# Patient Record
Sex: Female | Born: 1955 | Race: White | Hispanic: No
Health system: Southern US, Community
[De-identification: ages and names within clinical notes are randomized; demographics above are authoritative.]

## PROBLEM LIST (undated history)

## (undated) DIAGNOSIS — M25473 Effusion, unspecified ankle: Secondary | ICD-10-CM

## (undated) DIAGNOSIS — E079 Disorder of thyroid, unspecified: Secondary | ICD-10-CM

## (undated) DIAGNOSIS — F419 Anxiety disorder, unspecified: Secondary | ICD-10-CM

## (undated) DIAGNOSIS — F329 Major depressive disorder, single episode, unspecified: Secondary | ICD-10-CM

## (undated) DIAGNOSIS — Z923 Personal history of irradiation: Secondary | ICD-10-CM

## (undated) DIAGNOSIS — C50919 Malignant neoplasm of unspecified site of unspecified female breast: Secondary | ICD-10-CM

## (undated) DIAGNOSIS — J302 Other seasonal allergic rhinitis: Secondary | ICD-10-CM

## (undated) DIAGNOSIS — N189 Chronic kidney disease, unspecified: Secondary | ICD-10-CM

## (undated) DIAGNOSIS — E7800 Pure hypercholesterolemia, unspecified: Secondary | ICD-10-CM

## (undated) DIAGNOSIS — C801 Malignant (primary) neoplasm, unspecified: Secondary | ICD-10-CM

## (undated) DIAGNOSIS — F32A Depression, unspecified: Secondary | ICD-10-CM

## (undated) DIAGNOSIS — K219 Gastro-esophageal reflux disease without esophagitis: Secondary | ICD-10-CM

## (undated) DIAGNOSIS — G4730 Sleep apnea, unspecified: Secondary | ICD-10-CM

## (undated) DIAGNOSIS — R112 Nausea with vomiting, unspecified: Secondary | ICD-10-CM

## (undated) DIAGNOSIS — E039 Hypothyroidism, unspecified: Secondary | ICD-10-CM

## (undated) DIAGNOSIS — Z9221 Personal history of antineoplastic chemotherapy: Secondary | ICD-10-CM

## (undated) DIAGNOSIS — Z9889 Other specified postprocedural states: Secondary | ICD-10-CM

## (undated) HISTORY — DX: Disorder of thyroid, unspecified: E07.9

## (undated) HISTORY — DX: Gastro-esophageal reflux disease without esophagitis: K21.9

## (undated) HISTORY — PX: OTHER SURGICAL HISTORY: SHX169

## (undated) HISTORY — PX: HEEL SPUR EXCISION: SHX1733

## (undated) HISTORY — PX: EYE SURGERY: SHX253

## (undated) HISTORY — PX: ABDOMINAL HYSTERECTOMY: SHX81

---

## 2004-09-06 ENCOUNTER — Ambulatory Visit: Payer: Self-pay | Admitting: Obstetrics and Gynecology

## 2004-09-10 ENCOUNTER — Ambulatory Visit: Payer: Self-pay | Admitting: Urology

## 2004-09-28 ENCOUNTER — Ambulatory Visit: Payer: Self-pay | Admitting: Gastroenterology

## 2005-09-10 ENCOUNTER — Ambulatory Visit: Payer: Self-pay | Admitting: Obstetrics and Gynecology

## 2006-02-22 HISTORY — PX: RETINAL DETACHMENT SURGERY: SHX105

## 2006-09-16 ENCOUNTER — Ambulatory Visit: Payer: Self-pay | Admitting: Obstetrics and Gynecology

## 2006-12-08 ENCOUNTER — Ambulatory Visit: Payer: Self-pay | Admitting: Gastroenterology

## 2007-06-25 DIAGNOSIS — C50919 Malignant neoplasm of unspecified site of unspecified female breast: Secondary | ICD-10-CM

## 2007-06-25 HISTORY — PX: BREAST EXCISIONAL BIOPSY: SUR124

## 2007-06-25 HISTORY — PX: BREAST LUMPECTOMY: SHX2

## 2007-06-25 HISTORY — DX: Malignant neoplasm of unspecified site of unspecified female breast: C50.919

## 2007-09-21 ENCOUNTER — Ambulatory Visit: Payer: Self-pay | Admitting: Obstetrics and Gynecology

## 2007-09-23 ENCOUNTER — Ambulatory Visit: Payer: Self-pay | Admitting: Obstetrics and Gynecology

## 2007-10-13 ENCOUNTER — Ambulatory Visit: Payer: Self-pay | Admitting: General Surgery

## 2007-10-22 ENCOUNTER — Ambulatory Visit: Payer: Self-pay | Admitting: General Surgery

## 2007-10-23 ENCOUNTER — Ambulatory Visit: Payer: Self-pay | Admitting: Radiation Oncology

## 2007-10-23 ENCOUNTER — Ambulatory Visit: Payer: Self-pay | Admitting: General Surgery

## 2007-10-23 HISTORY — PX: BREAST SURGERY: SHX581

## 2007-10-25 ENCOUNTER — Inpatient Hospital Stay: Payer: Self-pay | Admitting: General Surgery

## 2007-11-12 ENCOUNTER — Ambulatory Visit: Payer: Self-pay | Admitting: Oncology

## 2007-11-19 ENCOUNTER — Ambulatory Visit: Payer: Self-pay | Admitting: Vascular Surgery

## 2007-11-23 ENCOUNTER — Ambulatory Visit: Payer: Self-pay | Admitting: Radiation Oncology

## 2007-11-23 ENCOUNTER — Ambulatory Visit: Payer: Self-pay | Admitting: Oncology

## 2007-12-23 ENCOUNTER — Ambulatory Visit: Payer: Self-pay | Admitting: Oncology

## 2007-12-23 ENCOUNTER — Ambulatory Visit: Payer: Self-pay | Admitting: Radiation Oncology

## 2008-01-23 ENCOUNTER — Ambulatory Visit: Payer: Self-pay | Admitting: Oncology

## 2008-01-23 ENCOUNTER — Ambulatory Visit: Payer: Self-pay | Admitting: Radiation Oncology

## 2008-02-23 ENCOUNTER — Ambulatory Visit: Payer: Self-pay | Admitting: Oncology

## 2008-02-23 ENCOUNTER — Ambulatory Visit: Payer: Self-pay | Admitting: Radiation Oncology

## 2008-03-24 ENCOUNTER — Ambulatory Visit: Payer: Self-pay | Admitting: Radiation Oncology

## 2008-03-24 ENCOUNTER — Ambulatory Visit: Payer: Self-pay | Admitting: Oncology

## 2008-04-24 ENCOUNTER — Ambulatory Visit: Payer: Self-pay | Admitting: Oncology

## 2008-04-24 ENCOUNTER — Ambulatory Visit: Payer: Self-pay | Admitting: Radiation Oncology

## 2008-05-24 ENCOUNTER — Ambulatory Visit: Payer: Self-pay | Admitting: Oncology

## 2008-05-24 ENCOUNTER — Ambulatory Visit: Payer: Self-pay | Admitting: Radiation Oncology

## 2008-06-24 ENCOUNTER — Ambulatory Visit: Payer: Self-pay | Admitting: Radiation Oncology

## 2008-06-24 ENCOUNTER — Ambulatory Visit: Payer: Self-pay | Admitting: Oncology

## 2008-07-25 ENCOUNTER — Ambulatory Visit: Payer: Self-pay | Admitting: Radiation Oncology

## 2008-07-25 ENCOUNTER — Ambulatory Visit: Payer: Self-pay | Admitting: Oncology

## 2008-08-22 ENCOUNTER — Ambulatory Visit: Payer: Self-pay | Admitting: Radiation Oncology

## 2008-08-22 ENCOUNTER — Ambulatory Visit: Payer: Self-pay | Admitting: Oncology

## 2008-09-22 ENCOUNTER — Ambulatory Visit: Payer: Self-pay | Admitting: Oncology

## 2008-09-22 ENCOUNTER — Ambulatory Visit: Payer: Self-pay | Admitting: Radiation Oncology

## 2008-10-22 ENCOUNTER — Ambulatory Visit: Payer: Self-pay | Admitting: Oncology

## 2008-10-22 ENCOUNTER — Ambulatory Visit: Payer: Self-pay | Admitting: Radiation Oncology

## 2008-11-22 ENCOUNTER — Ambulatory Visit: Payer: Self-pay | Admitting: Radiation Oncology

## 2008-11-22 ENCOUNTER — Ambulatory Visit: Payer: Self-pay | Admitting: Oncology

## 2008-12-22 ENCOUNTER — Ambulatory Visit: Payer: Self-pay | Admitting: Oncology

## 2009-01-22 ENCOUNTER — Ambulatory Visit: Payer: Self-pay | Admitting: Oncology

## 2009-02-22 ENCOUNTER — Ambulatory Visit: Payer: Self-pay | Admitting: Oncology

## 2009-03-02 ENCOUNTER — Ambulatory Visit: Payer: Self-pay | Admitting: Vascular Surgery

## 2009-03-24 ENCOUNTER — Ambulatory Visit: Payer: Self-pay | Admitting: Oncology

## 2009-04-24 ENCOUNTER — Ambulatory Visit: Payer: Self-pay | Admitting: Oncology

## 2009-05-24 ENCOUNTER — Ambulatory Visit: Payer: Self-pay | Admitting: Oncology

## 2009-05-25 ENCOUNTER — Ambulatory Visit: Payer: Self-pay | Admitting: Oncology

## 2009-06-24 ENCOUNTER — Ambulatory Visit: Payer: Self-pay | Admitting: Oncology

## 2009-06-24 HISTORY — PX: BREAST BIOPSY: SHX20

## 2009-08-22 ENCOUNTER — Ambulatory Visit: Payer: Self-pay | Admitting: Oncology

## 2009-08-28 ENCOUNTER — Ambulatory Visit: Payer: Self-pay | Admitting: Oncology

## 2009-08-31 ENCOUNTER — Ambulatory Visit: Payer: Self-pay | Admitting: Oncology

## 2009-09-04 ENCOUNTER — Ambulatory Visit: Payer: Self-pay | Admitting: Oncology

## 2009-09-22 ENCOUNTER — Ambulatory Visit: Payer: Self-pay | Admitting: Oncology

## 2009-09-26 ENCOUNTER — Ambulatory Visit: Payer: Self-pay | Admitting: Surgery

## 2009-11-22 ENCOUNTER — Ambulatory Visit: Payer: Self-pay | Admitting: Oncology

## 2009-12-05 ENCOUNTER — Ambulatory Visit: Payer: Self-pay | Admitting: Oncology

## 2009-12-22 ENCOUNTER — Ambulatory Visit: Payer: Self-pay | Admitting: Oncology

## 2010-02-22 ENCOUNTER — Ambulatory Visit: Payer: Self-pay | Admitting: Oncology

## 2010-02-27 ENCOUNTER — Ambulatory Visit: Payer: Self-pay | Admitting: Oncology

## 2010-03-07 ENCOUNTER — Ambulatory Visit: Payer: Self-pay | Admitting: Oncology

## 2010-03-24 ENCOUNTER — Ambulatory Visit: Payer: Self-pay | Admitting: Oncology

## 2010-04-24 ENCOUNTER — Ambulatory Visit: Payer: Self-pay | Admitting: Oncology

## 2010-06-07 ENCOUNTER — Ambulatory Visit: Payer: Self-pay | Admitting: Oncology

## 2010-06-24 ENCOUNTER — Ambulatory Visit: Payer: Self-pay | Admitting: Oncology

## 2010-09-10 ENCOUNTER — Ambulatory Visit: Payer: Self-pay | Admitting: Oncology

## 2010-09-17 ENCOUNTER — Ambulatory Visit: Payer: Self-pay | Admitting: Oncology

## 2010-09-18 LAB — CANCER ANTIGEN 27.29: CA 27.29: 15.4 U/mL (ref 0.0–38.6)

## 2010-09-23 ENCOUNTER — Ambulatory Visit: Payer: Self-pay | Admitting: Oncology

## 2010-12-17 ENCOUNTER — Ambulatory Visit: Payer: Self-pay | Admitting: Oncology

## 2010-12-18 LAB — CANCER ANTIGEN 27.29: CA 27.29: 14 U/mL (ref 0.0–38.6)

## 2010-12-23 ENCOUNTER — Ambulatory Visit: Payer: Self-pay | Admitting: Oncology

## 2011-03-12 ENCOUNTER — Ambulatory Visit: Payer: Self-pay | Admitting: Oncology

## 2011-04-15 ENCOUNTER — Ambulatory Visit: Payer: Self-pay | Admitting: Oncology

## 2011-04-25 ENCOUNTER — Ambulatory Visit: Payer: Self-pay | Admitting: Oncology

## 2011-06-04 ENCOUNTER — Ambulatory Visit: Payer: Self-pay | Admitting: Oncology

## 2011-06-25 ENCOUNTER — Ambulatory Visit: Payer: Self-pay | Admitting: Oncology

## 2011-09-11 ENCOUNTER — Ambulatory Visit: Payer: Self-pay | Admitting: Oncology

## 2011-12-01 ENCOUNTER — Emergency Department: Payer: Self-pay | Admitting: Emergency Medicine

## 2011-12-01 LAB — CBC
HCT: 38.8 % (ref 35.0–47.0)
MCHC: 33.8 g/dL (ref 32.0–36.0)
Platelet: 195 10*3/uL (ref 150–440)
RBC: 4.12 10*6/uL (ref 3.80–5.20)
RDW: 13.2 % (ref 11.5–14.5)

## 2011-12-01 LAB — COMPREHENSIVE METABOLIC PANEL
Albumin: 3.9 g/dL (ref 3.4–5.0)
Alkaline Phosphatase: 80 U/L (ref 50–136)
BUN: 22 mg/dL — ABNORMAL HIGH (ref 7–18)
Bilirubin,Total: 0.4 mg/dL (ref 0.2–1.0)
Calcium, Total: 9.4 mg/dL (ref 8.5–10.1)
Chloride: 105 mmol/L (ref 98–107)
Co2: 26 mmol/L (ref 21–32)
Creatinine: 0.78 mg/dL (ref 0.60–1.30)
EGFR (African American): >60
EGFR (Non-African Amer.): >60
Glucose: 131 mg/dL — ABNORMAL HIGH (ref 65–99)
Osmolality: 286 (ref 275–301)
Potassium: 3.4 mmol/L — ABNORMAL LOW (ref 3.5–5.1)
SGOT(AST): 29 U/L (ref 15–37)
Total Protein: 7.2 g/dL (ref 6.4–8.2)

## 2011-12-05 ENCOUNTER — Ambulatory Visit: Payer: Self-pay | Admitting: Oncology

## 2011-12-06 LAB — CANCER ANTIGEN 27.29: CA 27.29: 15.7 U/mL (ref 0.0–38.6)

## 2011-12-23 ENCOUNTER — Ambulatory Visit: Payer: Self-pay | Admitting: Oncology

## 2012-03-12 ENCOUNTER — Ambulatory Visit: Payer: Self-pay | Admitting: Oncology

## 2012-06-08 ENCOUNTER — Ambulatory Visit: Payer: Self-pay | Admitting: Oncology

## 2012-06-24 ENCOUNTER — Ambulatory Visit: Payer: Self-pay | Admitting: Oncology

## 2012-06-24 HISTORY — PX: BREAST BIOPSY: SHX20

## 2012-09-14 ENCOUNTER — Ambulatory Visit: Payer: Self-pay | Admitting: Oncology

## 2012-09-15 ENCOUNTER — Ambulatory Visit: Payer: Self-pay | Admitting: Oncology

## 2012-09-28 ENCOUNTER — Ambulatory Visit: Payer: Self-pay | Admitting: Surgery

## 2012-09-30 LAB — PATHOLOGY REPORT

## 2012-11-22 ENCOUNTER — Ambulatory Visit: Payer: Self-pay | Admitting: Oncology

## 2012-12-22 ENCOUNTER — Ambulatory Visit: Payer: Self-pay | Admitting: Oncology

## 2013-03-22 ENCOUNTER — Ambulatory Visit: Payer: Self-pay | Admitting: Specialist

## 2013-03-31 ENCOUNTER — Ambulatory Visit: Payer: Self-pay | Admitting: Surgery

## 2013-03-31 ENCOUNTER — Ambulatory Visit: Payer: Self-pay | Admitting: Oncology

## 2013-06-08 ENCOUNTER — Ambulatory Visit: Payer: Self-pay | Admitting: Oncology

## 2013-06-24 ENCOUNTER — Ambulatory Visit: Payer: Self-pay | Admitting: Oncology

## 2013-07-07 ENCOUNTER — Ambulatory Visit: Payer: Self-pay | Admitting: Specialist

## 2013-07-07 LAB — CBC
HCT: 38.6 % (ref 35.0–47.0)
HGB: 13.3 g/dL (ref 12.0–16.0)
MCH: 31.5 pg (ref 26.0–34.0)
MCHC: 34.5 g/dL (ref 32.0–36.0)
MCV: 91 fL (ref 80–100)
Platelet: 200 10*3/uL (ref 150–440)
RBC: 4.23 10*6/uL (ref 3.80–5.20)
RDW: 12 % (ref 11.5–14.5)
WBC: 5.2 10*3/uL (ref 3.6–11.0)

## 2013-07-07 LAB — URINALYSIS, COMPLETE
BACTERIA: NONE SEEN
Bilirubin,UR: NEGATIVE
Blood: NEGATIVE
Glucose,UR: NEGATIVE mg/dL (ref 0–75)
Ketone: NEGATIVE
Leukocyte Esterase: NEGATIVE
Nitrite: NEGATIVE
Ph: 5 (ref 4.5–8.0)
Protein: NEGATIVE
RBC,UR: 2 /HPF (ref 0–5)
Specific Gravity: 1.033 (ref 1.003–1.030)
Squamous Epithelial: 1
WBC UR: 1 /HPF (ref 0–5)

## 2013-07-07 LAB — BASIC METABOLIC PANEL
ANION GAP: 7 (ref 7–16)
BUN: 18 mg/dL (ref 7–18)
Calcium, Total: 9.2 mg/dL (ref 8.5–10.1)
Chloride: 105 mmol/L (ref 98–107)
Co2: 27 mmol/L (ref 21–32)
Creatinine: 0.84 mg/dL (ref 0.60–1.30)
EGFR (African American): >60
EGFR (Non-African Amer.): >60
Glucose: 116 mg/dL — ABNORMAL HIGH (ref 65–99)
Osmolality: 280 (ref 275–301)
Potassium: 3.5 mmol/L (ref 3.5–5.1)
SODIUM: 139 mmol/L (ref 136–145)

## 2013-07-07 LAB — PROTIME-INR
INR: 1
Prothrombin Time: 12.8 secs (ref 11.5–14.7)

## 2013-07-07 LAB — APTT: ACTIVATED PTT: 26.4 s (ref 23.6–35.9)

## 2013-07-07 LAB — MRSA PCR SCREENING

## 2013-07-14 ENCOUNTER — Inpatient Hospital Stay: Payer: Self-pay | Admitting: Specialist

## 2013-07-14 HISTORY — PX: JOINT REPLACEMENT: SHX530

## 2013-07-15 LAB — BASIC METABOLIC PANEL
Anion Gap: 4 — ABNORMAL LOW (ref 7–16)
BUN: 12 mg/dL (ref 7–18)
Calcium, Total: 9 mg/dL (ref 8.5–10.1)
Chloride: 104 mmol/L (ref 98–107)
Co2: 29 mmol/L (ref 21–32)
Creatinine: 0.82 mg/dL (ref 0.60–1.30)
EGFR (African American): >60
Glucose: 140 mg/dL — ABNORMAL HIGH (ref 65–99)
Osmolality: 276 (ref 275–301)
Potassium: 4.2 mmol/L (ref 3.5–5.1)
Sodium: 137 mmol/L (ref 136–145)

## 2013-07-15 LAB — CBC WITH DIFFERENTIAL/PLATELET
BASOS ABS: 0 10*3/uL (ref 0.0–0.1)
Basophil %: 0.3 %
Eosinophil #: 0.1 10*3/uL (ref 0.0–0.7)
Eosinophil %: 1.1 %
HCT: 34.9 % — AB (ref 35.0–47.0)
HGB: 12.1 g/dL (ref 12.0–16.0)
LYMPHS ABS: 0.8 10*3/uL — AB (ref 1.0–3.6)
LYMPHS PCT: 10.7 %
MCH: 32.3 pg (ref 26.0–34.0)
MCHC: 34.7 g/dL (ref 32.0–36.0)
MCV: 93 fL (ref 80–100)
MONO ABS: 0.5 x10 3/mm (ref 0.2–0.9)
Monocyte %: 6.9 %
NEUTROS ABS: 6.4 10*3/uL (ref 1.4–6.5)
Neutrophil %: 81 %
Platelet: 168 10*3/uL (ref 150–440)
RBC: 3.74 10*6/uL — AB (ref 3.80–5.20)
RDW: 12.3 % (ref 11.5–14.5)
WBC: 7.9 10*3/uL (ref 3.6–11.0)

## 2013-07-16 LAB — HEMOGLOBIN: HGB: 11.2 g/dL — ABNORMAL LOW (ref 12.0–16.0)

## 2013-09-30 ENCOUNTER — Ambulatory Visit: Payer: Self-pay | Admitting: Oncology

## 2014-02-24 ENCOUNTER — Ambulatory Visit: Payer: Self-pay | Admitting: Oncology

## 2014-03-30 ENCOUNTER — Ambulatory Visit: Payer: Self-pay | Admitting: Oncology

## 2014-06-09 ENCOUNTER — Ambulatory Visit: Payer: Self-pay | Admitting: Oncology

## 2014-06-10 LAB — CANCER ANTIGEN 27.29: CA 27.29: 14.1 U/mL (ref 0.0–38.6)

## 2014-06-14 ENCOUNTER — Ambulatory Visit (INDEPENDENT_AMBULATORY_CARE_PROVIDER_SITE_OTHER): Payer: 59 | Admitting: Podiatry

## 2014-06-14 ENCOUNTER — Ambulatory Visit (INDEPENDENT_AMBULATORY_CARE_PROVIDER_SITE_OTHER): Payer: 59

## 2014-06-14 ENCOUNTER — Encounter: Payer: Self-pay | Admitting: Podiatry

## 2014-06-14 VITALS — BP 118/58 | HR 80 | Resp 16 | Ht 68.0 in | Wt 257.0 lb

## 2014-06-14 DIAGNOSIS — S93402A Sprain of unspecified ligament of left ankle, initial encounter: Secondary | ICD-10-CM

## 2014-06-14 DIAGNOSIS — M779 Enthesopathy, unspecified: Secondary | ICD-10-CM

## 2014-06-14 DIAGNOSIS — M0088 Arthritis due to other bacteria, vertebrae: Secondary | ICD-10-CM

## 2014-06-14 DIAGNOSIS — M25572 Pain in left ankle and joints of left foot: Secondary | ICD-10-CM | POA: Diagnosis not present

## 2014-06-14 NOTE — Progress Notes (Signed)
Subjective:     Patient ID: Heather Mcpherson, female   DOB: 05-Dec-1955, 58 y.o.   MRN: [id number]  HPI patient states I'm getting a lot of pain in my left ankle and I am getting a lot of swelling which is been occurring for a long period of time it seems to be worse over the last year. Patient states she's no longer working but still having a lot of pain and swelling   Review of Systems     Objective:   Physical Exam Neurovascular status intact muscle strength adequate with range of motion of the subtalar midtarsal joint within normal limits. Patient's noted to have quite a bit of discomfort in the sinus tarsi left reduced ankle motion left and edema in the left ankle lower leg with varicosities noted    Assessment:     Concerned about chronic varicose type condition left along with sinus tarsitis and inflammatory capsulitis left with edema present. And sprained ankle    Plan:     Reviewed all conditions and discussed them separately. Today I explained range of motion exercises for the sprained ankle and compression and I'm sending for vascular evaluation to the venous system that the patient has due to the swelling. Injected the sinus tarsi 3 motor Kenalog 5 g Xylocaine mixture to reduce the inflammation and reappoint her recheck in several weeks

## 2014-06-24 ENCOUNTER — Ambulatory Visit: Payer: Self-pay | Admitting: Oncology

## 2014-07-01 ENCOUNTER — Telehealth: Payer: Self-pay | Admitting: *Deleted

## 2014-07-01 ENCOUNTER — Encounter: Payer: Self-pay | Admitting: Podiatry

## 2014-07-01 NOTE — Telephone Encounter (Signed)
Patient called wanting her results from [facility]. Per dr Paulla Dolly her studies came back normal. Left message for patient. She is to call back with any questions or concerns

## 2014-07-05 ENCOUNTER — Telehealth: Payer: Self-pay | Admitting: *Deleted

## 2014-07-05 NOTE — Telephone Encounter (Signed)
Spoke to Heather Mcpherson regarding her results, all labs are normal. Patient stated that she was doing better since the shot in her ankle, will call us if needed

## 2014-10-03 ENCOUNTER — Ambulatory Visit
Admit: 2014-10-03 | Disposition: A | Discharge status: Home / Self Care | Payer: Self-pay | Attending: Oncology | Admitting: Oncology

## 2014-10-15 NOTE — Op Note (Signed)
PATIENT NAME:  Heather Mcpherson, WROBEL MR#:  [id number] DATE OF BIRTH:  11/27/1955  DATE OF PROCEDURE:  07/14/2013  PREOPERATIVE DIAGNOSIS: Advanced osteoarthritis, right knee with valgus deformity.   POSTOPERATIVE DIAGNOSIS: Advanced osteoarthritis, right knee with valgus deformity.   PROCEDURE: De[place]w cemented LCS rotating platform total knee replacement (standard plus femur/patella, #3 MBT tibial tray, a 15 mm rotating platform polyethylene insert).   SURGEON: Park Breed, M.D.   ASSISTANT: None.  ANESTHESIA: Spinal plus femoral nerve block.   COMPLICATIONS: None.   DRAINS: Two Autovacs.   ESTIMATED BLOOD LOSS: 25 mL. Replaced: None.   DESCRIPTION OF PROCEDURE: The patient was brought to the Operating Room where she underwent satisfactory spinal anesthesia and right femoral nerve block. She was placed and padded appropriately on the Operating Room table in the supine position. The right leg was prepped and draped in sterile fashion. An Esmarch was applied. The tourniquet was inflated to 350 mmHg. Tourniquet time was 109 minutes. An anterior midline incision was made and dissection carried out sharply through subcutaneous tissue. Medial parapatellar incision was made and the soft tissue debridement carried out. The patella was everted. The large spurs were removed from the patella. The knee was seen to have significant valgus deformity. After soft tissue debridement the proximal tibial alignment guide was applied and then pinned in place appropriately. The proximal tibial cut was made and the femur was then sized as a standard plus. The anterior cutting block was put in place with a centering hole and the rotation guide inserted, 12.5 and then 15 mm shim was inserted to establish the flexion gap at 15 mm. The cutting block was pinned and the anterior and posterior cuts made. Distal femoral cut was made at 4 degrees of valgus and the finishing guide was applied and the finishing cuts made. The  tibia was sized as a #3. Because of the patient's weight and the fact that the bone was quite soft and porous. I elected to use a MBT revision tray on the tibia. The centering hole was made and the trial. The insert and femur were put in place and the knee articulated nicely. There was good range of motion and good alignment throughout. The patella was cut and drilled and a trial inserted. This tracked well with no need for lateral release. The trials were removed and the soft tissues posteriorly injected with Marcaine Toradol and morphine. The wound was thoroughly irrigated free of debris and dried. The #3 MBT tray was placed in the tibia with cement along with a standard plus femur with a 15 mm rotating platform polyethylene insert. This was articulated and excess cement removed. The patellar component was then cemented in place as well. While the cement was hardening the remaining Marcaine, Toradol and morphine was placed in the soft tissues. After thorough irrigation again and final debridement the Autovac drains were inserted. The capsule was closed with #2 Quill and 30 degrees of flexion and the subcutaneous tissue was closed with 0 quill.  Skin was closed with staples and a dry sterile dressing with TENS pads was applied. Polar Care and knee immobilizer were applied and the tourniquet was deflated with good return of blood flow to the foot. The patient was transferred to her hospital bed and taken to recovery in good condition.     ____________________________ Park Breed, MD hem:sg D: 07/14/2013 10:49:50 ET T: 07/14/2013 11:26:59 ET JOB#: [id number]  cc: Park Breed, MD, <Dictator> Park Breed MD ELECTRONICALLY SIGNED  07/15/2013 10:51 

## 2014-10-15 NOTE — Discharge Summary (Signed)
PATIENT NAME:  Heather Mcpherson, Heather Mcpherson MR#:  [id number] DATE OF BIRTH:  02/13/1956  DATE OF ADMISSION:  07/14/2013 DATE OF DISCHARGE:  07/17/2013  FINAL DIAGNOSES:  1.  Advanced osteoarthritis, right knee.  2.  Hypothyroidism.  3.  Gastroesophageal reflux disease.  4.  Hypercholesterolemia.   OPERATIONS:  On 07/14/2013, cemented right total knee replacement (DePuy LCS rotating platform prosthesis).   CONSULTATIONS: None.   DISCHARGE MEDICATIONS: Synthroid 112 mcg daily, venlafaxine 150 mg daily, Celebrex 200 mg daily, acyclovir 800 mg daily, omeprazole 40 mg daily, atorvastatin 10 mg daily, Norco 7.5/320 every 6 hours p.r.n. pain, enteric-coated aspirin 1 p.o. b.i.d., iron 1 p.o. daily x 1 month.   HISTORY OF PRESENT ILLNESS: The patient is a 59 year old female with advanced osteoarthritis of the right knee. She was followed for several years and has had treatment with NSAIDs, injections, exercise and bracing. She reached the point where she had pain on a constant basis which interfered with her activities of shopping, cleaning and normal activity. She desired a right total knee replacement. The risks and benefits were discussed with her at length along with postoperative protocol.   PAST MEDICAL HISTORY: ILLNESSES: As above.  HOME MEDICATIONS: As above.  ALLERGIES: None.  REVIEW OF SYSTEMS: Unremarkable.  PREVIOUS OPERATIONS:  Hysterectomy and breast surgery.   FAMILY HISTORY: Unremarkable.   SOCIAL HISTORY: Unremarkable.   PHYSICAL EXAMINATION:  GENERAL:  The patient is alert and cooperative.  EXTREMITIES:  The right knee had increased valgus with motion from 10 to 95 degrees. There was pain at the joint lines. She was stable. Circulation, sensation and motor function was good distally. The skin was intact.   LABORATORY DATA: On admission was satisfactory.   HOSPITAL COURSE: On 07/14/2013, the patient underwent cemented right total knee replacement. Postoperatively she did well, minimal  pain and swelling. Her hemoglobin was 11.2 on the second postop day. She made good progress with physical therapy. Dressings were changed and her wound was benign. She was discharged partial weight-bearing on 07/17/2013. She is getting home physical therapy. She will be seen in my office in 2 weeks for exam.   ____________________________ Park Breed, MD hem:cs D: 08/08/2013 16:22:40 ET T: 08/08/2013 18:05:12 ET JOB#: [id number]  cc: Park Breed, MD, <Dictator> Ricky L. Amalia Hailey, MD Park Breed MD ELECTRONICALLY SIGNED 08/18/2013 18:22

## 2014-10-15 NOTE — H&P (Signed)
   Subjective/Chief Complaint Right knee pain   History of Present Illness 59 year old female with progressively worsening osteoarthritis right knee over several years.  She has been treated with NSAIDs, injections, braces, and exercise but now has daily pain and limp which interferes with normal activities.  Also has pain at night and she is ready for total knee replacement.  Risks and benefits of surgery were discussed at length including but not limited to infection, non union, nerve or blood vessed damage, non union, need for repeat surgery, blood clots and lung emboli, and death.   Past Med/Surgical Hx:  arthritis:   Hypothyroidism:   hysterectomy:   Breast Surgery:   ALLERGIES:  No Known Allergies:   HOME MEDICATIONS: Medication Instructions Status  Synthroid 112 mcg (0.112 mg) oral tablet 1 tab(s) orally once a day Active  venlafaxine 150 mg oral capsule, extended release 1 cap(s) orally once a day Active  Fish Oil 1000 mg oral capsule 2 cap(s) orally once a day (at bedtime) Active  Celebrex 200 mg oral capsule 1 cap(s) orally once a day with meal. Active  acyclovir 800 mg oral tablet 1 tab(s) orally once a day Active  Arthritis Pain 500 mg oral tablet 2 tab(s) orally every 6 hours, As Needed - for Pain Active  ZyrTEC 10 mg oral tablet 1 tab(s) orally once a day Active  omeprazole-sodium bicarbonate 40 mg-1100 mg oral capsule 1 cap(s) orally once a day Active  atorvastatin 10 mg oral tablet 1 tab(s) orally once a day (at bedtime) Active  Centrum Flavor Burst oral tablet, chewable 1 tab(s) orally in the morning and at bedtime Active  magnesium oxide 400 mg oral tablet 1 tab(s) orally in the morning and at bedtime Active   Family and Social History:  Family History Non-Contributory   Social History negative tobacco   Review of Systems:  Fever/Chills No   Cough No   Sputum No   Abdominal Pain No   Physical Exam:  GEN well developed, well nourished, obese   HEENT pink  conjunctivae   NECK No masses   RESP normal resp effort   CARD regular rate   ABD denies tenderness   LYMPH negative neck   EXTR negative edema, Right knee with increased valgus.  range of motion 10-95*.  Pain at joint lines.  circulation/sensation/motor function good and skin intact.   SKIN normal to palpation   NEURO motor/sensory function intact   PSYCH alert, A+O to time, place, person   Radiology Results: LabUnknown:    29-Sep-14 10:25, Bilateral Knees Standing  PACS Image    Assessment/Admission Diagnosis Advanced osteoarthritis right knee   Plan Right total knee replacement.   Electronic Signatures: Park Breed (MD)  (Signed 20-Jan-15 17:34)  Authored: CHIEF COMPLAINT and HISTORY, PAST MEDICAL/SURGIAL HISTORY, ALLERGIES, HOME MEDICATIONS, FAMILY AND SOCIAL HISTORY, REVIEW OF SYSTEMS, PHYSICAL EXAM, Radiology, ASSESSMENT AND PLAN   Last Updated: 20-Jan-15 17:34 by Park Breed (MD)

## 2015-01-18 ENCOUNTER — Encounter: Payer: Self-pay | Admitting: *Deleted

## 2015-01-19 ENCOUNTER — Encounter
Admission: RE | Disposition: A | Discharge status: Home / Self Care | Payer: Self-pay | Source: Ambulatory Visit | Attending: Gastroenterology

## 2015-01-19 ENCOUNTER — Ambulatory Visit: Payer: 59 | Admitting: Anesthesiology

## 2015-01-19 ENCOUNTER — Ambulatory Visit
Admission: RE | Admit: 2015-01-19 | Discharge: 2015-01-19 | Disposition: A | Discharge status: Home / Self Care | Payer: 59 | Source: Ambulatory Visit | Attending: Gastroenterology | Admitting: Gastroenterology

## 2015-01-19 DIAGNOSIS — F419 Anxiety disorder, unspecified: Secondary | ICD-10-CM | POA: Insufficient documentation

## 2015-01-19 DIAGNOSIS — Z79899 Other long term (current) drug therapy: Secondary | ICD-10-CM | POA: Diagnosis not present

## 2015-01-19 DIAGNOSIS — Z1211 Encounter for screening for malignant neoplasm of colon: Secondary | ICD-10-CM | POA: Diagnosis present

## 2015-01-19 DIAGNOSIS — E780 Pure hypercholesterolemia: Secondary | ICD-10-CM | POA: Insufficient documentation

## 2015-01-19 DIAGNOSIS — F329 Major depressive disorder, single episode, unspecified: Secondary | ICD-10-CM | POA: Insufficient documentation

## 2015-01-19 DIAGNOSIS — E039 Hypothyroidism, unspecified: Secondary | ICD-10-CM | POA: Diagnosis not present

## 2015-01-19 DIAGNOSIS — K219 Gastro-esophageal reflux disease without esophagitis: Secondary | ICD-10-CM | POA: Diagnosis not present

## 2015-01-19 DIAGNOSIS — K640 First degree hemorrhoids: Secondary | ICD-10-CM | POA: Insufficient documentation

## 2015-01-19 DIAGNOSIS — Z7722 Contact with and (suspected) exposure to environmental tobacco smoke (acute) (chronic): Secondary | ICD-10-CM | POA: Diagnosis not present

## 2015-01-19 DIAGNOSIS — D123 Benign neoplasm of transverse colon: Secondary | ICD-10-CM | POA: Insufficient documentation

## 2015-01-19 DIAGNOSIS — D124 Benign neoplasm of descending colon: Secondary | ICD-10-CM | POA: Diagnosis not present

## 2015-01-19 HISTORY — DX: Anxiety disorder, unspecified: F41.9

## 2015-01-19 HISTORY — DX: Major depressive disorder, single episode, unspecified: F32.9

## 2015-01-19 HISTORY — PX: COLONOSCOPY WITH PROPOFOL: SHX5780

## 2015-01-19 HISTORY — DX: Hypothyroidism, unspecified: E03.9

## 2015-01-19 HISTORY — DX: Depression, unspecified: F32.A

## 2015-01-19 HISTORY — DX: Pure hypercholesterolemia, unspecified: E78.00

## 2015-01-19 HISTORY — DX: Malignant (primary) neoplasm, unspecified: C80.1

## 2015-01-19 SURGERY — COLONOSCOPY WITH PROPOFOL
Anesthesia: General

## 2015-01-19 MED ORDER — SODIUM CHLORIDE 0.9 % IV SOLN: INTRAVENOUS | Status: DC

## 2015-01-19 MED ORDER — FENTANYL CITRATE (PF) 100 MCG/2ML IJ SOLN
INTRAMUSCULAR | Status: DC | PRN
  Administered 2015-01-19: 50 ug via INTRAVENOUS

## 2015-01-19 MED ORDER — SODIUM CHLORIDE 0.9 % IV SOLN
INTRAVENOUS | Status: DC
  Administered 2015-01-19: 1000 mL via INTRAVENOUS
  Administered 2015-01-19: 11:00:00 via INTRAVENOUS

## 2015-01-19 MED ORDER — PROPOFOL 10 MG/ML IV BOLUS
INTRAVENOUS | Status: DC | PRN
  Administered 2015-01-19: 100 mg via INTRAVENOUS

## 2015-01-19 MED ORDER — MIDAZOLAM HCL 5 MG/5ML IJ SOLN
INTRAMUSCULAR | Status: DC | PRN
  Administered 2015-01-19: 2 mg via INTRAVENOUS

## 2015-01-19 MED ORDER — PROPOFOL INFUSION 10 MG/ML OPTIME
INTRAVENOUS | Status: DC | PRN
Start: 2015-01-19 — End: 2015-01-19
  Administered 2015-01-19: 160 ug/kg/min via INTRAVENOUS

## 2015-01-19 NOTE — H&P (Signed)
Primary Care Physician:  Glendon Axe, MD  Pre-Procedure History & Physical: HPI:  Heather Mcpherson is a 59 y.o. female is here for an colonoscopy.   Past Medical History  Diagnosis Date  . Thyroid disease   . GERD (gastroesophageal reflux disease)   . Hypothyroidism   . Cancer     breast  . Hypercholesteremia   . Depression   . Anxiety     Past Surgical History  Procedure Laterality Date  . Abdominal hysterectomy    . Eye surgery      cataracts  . Surgery on heel, foot elbow    . Evacuation of hematoma right breast    . Joint replacement      Prior to Admission medications   Medication Sig Start Date End Date Taking? Authorizing Provider  meloxicam (MOBIC) 15 MG tablet Take 15 mg by mouth daily.   Yes Historical Provider, MD  SYNTHROID 112 MCG tablet  06/12/14  Yes Historical Provider, MD  acyclovir (ZOVIRAX) 800 MG tablet Take 800 mg by mouth 5 (five) times daily.    Historical Provider, MD  AFLURIA PRESERVATIVE FREE 0.5 ML SUSY  04/11/14   Historical Provider, MD  amoxicillin-clavulanate (AUGMENTIN) 875-125 MG per tablet  05/03/14   Historical Provider, MD  atorvastatin (LIPITOR) 10 MG tablet  06/12/14   Historical Provider, MD  celecoxib (CELEBREX) 200 MG capsule  06/12/14   Historical Provider, MD  Fish Oil-Cholecalciferol (FISH OIL + D3 PO) Take by mouth.    Historical Provider, MD  letrozole (FEMARA) 2.5 MG tablet Take 2.5 mg by mouth daily.    Historical Provider, MD  Magnesium 200 MG TABS Take by mouth.    Historical Provider, MD  Melaton-Thean-Cham-PassF-LBalm (MELATONIN + L-THEANINE PO) Take by mouth.    Historical Provider, MD  Omeprazole-Sodium Bicarbonate (ZEGERID) 20-1100 MG CAPS capsule  06/12/14   Historical Provider, MD  venlafaxine XR (EFFEXOR-XR) 150 MG 24 hr capsule  06/12/14   Historical Provider, MD    Allergies as of 01/13/2015  . (No Known Allergies)    History reviewed. No pertinent family history.  History   Social History  . Marital  Status: Single    Spouse Name: N/A  . Number of Children: N/A  . Years of Education: N/A   Occupational History  . Not on file.   Social History Main Topics  . Smoking status: Passive Smoke Exposure - Never Smoker  . Smokeless tobacco: Not on file  . Alcohol Use: Not on file  . Drug Use: Not on file  . Sexual Activity: Not on file   Other Topics Concern  . Not on file   Social History Narrative     Physical Exam: BP 108/93 mmHg  Pulse 99  Temp(Src) 97 F (36.1 C) (Tympanic)  Resp 18  Ht 5\' 8"  (1.727 m)  Wt 113.399 kg (250 lb)  BMI 38.02 kg/m2  SpO2 98% General:   Alert,  pleasant and cooperative in NAD Head:  Normocephalic and atraumatic. Neck:  Supple; no masses or thyromegaly. Lungs:  Clear throughout to auscultation.    Heart:  Regular rate and rhythm. Abdomen:  Soft, nontender and nondistended. Normal bowel sounds, without guarding, and without rebound.   Neurologic:  Alert and  oriented x4;  grossly normal neurologically.  Impression/Plan: Heather Mcpherson is here for an colonoscopy to be performed for avg risk screening  Risks, benefits, limitations, and alternatives regarding  colonoscopy have been reviewed with the patient.  Questions have been answered.  All parties agreeable.   Josefine Class, MD  01/19/2015, 10:48 AM

## 2015-01-19 NOTE — Anesthesia Preprocedure Evaluation (Signed)
Anesthesia Evaluation  Patient identified by MRN, date of birth, ID band Patient awake    Reviewed: Allergy & Precautions, NPO status , Patient's Chart, lab work & pertinent test results  History of Anesthesia Complications (+) PONV  Airway Mallampati: II  TM Distance: >3 FB Neck ROM: Full    Dental  (+) Teeth Intact   Pulmonary          Cardiovascular     Neuro/Psych Anxiety Depression    GI/Hepatic GERD-  Medicated and Poorly Controlled,  Endo/Other  Hypothyroidism   Renal/GU      Musculoskeletal   Abdominal   Peds  Hematology   Anesthesia Other Findings   Reproductive/Obstetrics                             Anesthesia Physical Anesthesia Plan  ASA: II  Anesthesia Plan: General   Post-op Pain Management:    Induction: Intravenous  Airway Management Planned: Nasal Cannula  Additional Equipment:   Intra-op Plan:   Post-operative Plan:   Informed Consent: I have reviewed the patients History and Physical, chart, labs and discussed the procedure including the risks, benefits and alternatives for the proposed anesthesia with the patient or authorized representative who has indicated his/her understanding and acceptance.     Plan Discussed with:   Anesthesia Plan Comments:         Anesthesia Quick Evaluation

## 2015-01-19 NOTE — Op Note (Signed)
[facility] Patient Name: Heather Mcpherson Procedure Date: 01/19/2015 10:50 AM MRN: [id number] Account #: [id number] Date of Birth: 08-19-1955 Admit Type: Outpatient Age: 59 Room: [facility] ENDO ROOM 3 Gender: Female Note Status: Finalized Procedure:         Colonoscopy Indications:       Screening for colorectal malignant neoplasm, Last                     colonoscopy 10 years ago Patient Profile:   This is a 58 year old female. Providers:         Gerrit Heck. Rayann Heman, MD Referring MD:      Glendon Axe (Referring MD) Medicines:         Propofol per Anesthesia Complications:     No immediate complications. Procedure:         Pre-Anesthesia Assessment:                    - Prior to the procedure, a History and Physical was                     performed, and patient medications, allergies and                     sensitivities were reviewed. The patient's tolerance of                     previous anesthesia was reviewed.                    After obtaining informed consent, the colonoscope was                     passed under direct vision. Throughout the procedure, the                     patient's blood pressure, pulse, and oxygen saturations                     were monitored continuously. The Colonoscope was                     introduced through the anus and advanced to the the cecum,                     identified by appendiceal orifice and ileocecal valve. The                     colonoscopy was performed without difficulty. The patient                     tolerated the procedure well. The quality of the bowel                     preparation was excellent. Findings:      The perianal exam findings include non-thrombosed external hemorrhoids.      A 2 mm polyp was found at the hepatic flexure. The polyp was sessile.       The polyp was removed with a jumbo cold forceps. Resection and retrieval       were complete.      A 2 mm polyp was found in the  descending colon. The polyp was sessile.       The polyp was removed with a jumbo cold forceps. Resection and retrieval  were complete.      Internal hemorrhoids were found during retroflexion. The hemorrhoids       were Grade I (internal hemorrhoids that do not prolapse).      The exam was otherwise without abnormality. Impression:        - Non-thrombosed external hemorrhoids found on perianal                     exam.                    - One 2 mm polyp at the hepatic flexure. Resected and                     retrieved.                    - One 2 mm polyp in the descending colon. Resected and                     retrieved.                    - Internal hemorrhoids.                    - The examination was otherwise normal. Recommendation:    - Observe patient in GI recovery unit.                    - High fiber diet.                    - Continue present medications.                    - Await pathology results.                    - Repeat colonoscopy for surveillance/screening based on                     pathology results, 5 years for surveillancce if                     adenomatous, otherwise repeat in 10 years for screening.                    - Return to referring physician.                    - The findings and recommendations were discussed with the                     patient.                    - The findings and recommendations were discussed with the                     patient's family. Procedure Code(s): --- Professional ---                    [phone], Colonoscopy, flexible; with biopsy, single or                     multiple CPT copyright 2014 American Medical Association. All rights reserved. The codes documented in this report are preliminary and upon coder review may  be revised to meet current compliance requirements. Mellody Life, MD 01/19/2015 11:18:52 AM This  report has been signed electronically. Number of Addenda: 0 Note Initiated On: 01/19/2015 10:50  AM Scope Withdrawal Time: 0 hours 13 minutes 57 seconds  Total Procedure Duration: 0 hours 18 minutes 28 seconds       [facility]

## 2015-01-19 NOTE — Discharge Instructions (Signed)

## 2015-01-19 NOTE — Transfer of Care (Signed)
Immediate Anesthesia Transfer of Care Note  Patient: Heather Mcpherson  Procedure(s) Performed: Procedure(s): COLONOSCOPY WITH PROPOFOL (N/A)  Patient Location: PACU  Anesthesia Type:General  Level of Consciousness: awake  Airway & Oxygen Therapy: Patient Spontanous Breathing and Patient connected to nasal cannula oxygen  Post-op Assessment: Report given to RN and Post -op Vital signs reviewed and stable  Post vital signs: Reviewed and stable  Last Vitals:  Filed Vitals:   01/19/15 1001  BP: 108/93  Pulse: 99  Temp: 36.1 C  Resp: 18    Complications: No apparent anesthesia complications

## 2015-01-19 NOTE — Anesthesia Postprocedure Evaluation (Signed)
  Anesthesia Post-op Note  Patient: Heather Mcpherson  Procedure(s) Performed: Procedure(s): COLONOSCOPY WITH PROPOFOL (N/A)  Anesthesia type:General  Patient location: PACU  Post pain: Pain level controlled  Post assessment: Post-op Vital signs reviewed, Patient's Cardiovascular Status Stable, Respiratory Function Stable, Patent Airway and No signs of Nausea or vomiting  Post vital signs: Reviewed and stable  Last Vitals:  Filed Vitals:   01/19/15 1150  BP: 146/83  Pulse: 73  Temp:   Resp: 18    Level of consciousness: awake, alert  and patient cooperative  Complications: No apparent anesthesia complications

## 2015-01-20 ENCOUNTER — Encounter: Payer: Self-pay | Admitting: Gastroenterology

## 2015-01-20 LAB — SURGICAL PATHOLOGY

## 2015-02-20 ENCOUNTER — Telehealth: Payer: Self-pay | Admitting: *Deleted

## 2015-02-20 NOTE — Telephone Encounter (Signed)
Patient needs 6 months mammogram scheduled in October.   Patient also has a lesion on right breast at 5:00 pm. Patient went to dermatologist-Dr. Aubery Lapping in [place] to examine this area. A biopsy is scheduled for tomorrow.  Please let Dr. Grayland Ormond know.   Patient is unable to schedule her mammogram during the following dates since she is out of town on 10/1-10/16. She would like to have her mammogram ordered before 10/26 due to upcoming knee surgery. Also her preop is on 10/19 at 1pm (so this time needs to be avoided as well).

## 2015-02-21 ENCOUNTER — Other Ambulatory Visit: Payer: Self-pay | Admitting: *Deleted

## 2015-02-21 ENCOUNTER — Other Ambulatory Visit: Payer: Self-pay | Admitting: Oncology

## 2015-02-21 DIAGNOSIS — C50911 Malignant neoplasm of unspecified site of right female breast: Secondary | ICD-10-CM

## 2015-02-21 DIAGNOSIS — C50919 Malignant neoplasm of unspecified site of unspecified female breast: Secondary | ICD-10-CM

## 2015-02-21 NOTE — Telephone Encounter (Signed)
Order for mammogram entered in [place].

## 2015-02-24 ENCOUNTER — Other Ambulatory Visit: Payer: Self-pay | Admitting: Oncology

## 2015-02-24 DIAGNOSIS — C50919 Malignant neoplasm of unspecified site of unspecified female breast: Secondary | ICD-10-CM

## 2015-02-24 NOTE — Telephone Encounter (Signed)
Can we get results on the biopsy?  Thanks.

## 2015-03-01 NOTE — Telephone Encounter (Signed)
Left a message with the office of Dr. Aubery Lapping requesting pathology results be faxed to our office.

## 2015-04-05 ENCOUNTER — Other Ambulatory Visit: Payer: 59

## 2015-04-05 ENCOUNTER — Ambulatory Visit: Payer: 59

## 2015-04-05 ENCOUNTER — Other Ambulatory Visit: Payer: Self-pay

## 2015-04-12 ENCOUNTER — Encounter
Admission: RE | Admit: 2015-04-12 | Discharge: 2015-04-12 | Disposition: A | Discharge status: Home / Self Care | Payer: 59 | Source: Ambulatory Visit | Attending: Specialist | Admitting: Specialist

## 2015-04-12 DIAGNOSIS — C50911 Malignant neoplasm of unspecified site of right female breast: Secondary | ICD-10-CM | POA: Diagnosis not present

## 2015-04-12 DIAGNOSIS — E7800 Pure hypercholesterolemia, unspecified: Secondary | ICD-10-CM | POA: Diagnosis not present

## 2015-04-12 HISTORY — DX: Chronic kidney disease, unspecified: N18.9

## 2015-04-12 HISTORY — DX: Effusion, unspecified ankle: M25.473

## 2015-04-12 LAB — URINALYSIS COMPLETE WITH MICROSCOPIC (ARMC ONLY)
BILIRUBIN URINE: NEGATIVE
Glucose, UA: NEGATIVE mg/dL
Hgb urine dipstick: NEGATIVE
KETONES UR: NEGATIVE mg/dL
LEUKOCYTES UA: NEGATIVE
NITRITE: NEGATIVE
PH: 5 (ref 5.0–8.0)
PROTEIN: 30 mg/dL — AB
Specific Gravity, Urine: 1.032 — ABNORMAL HIGH (ref 1.005–1.030)

## 2015-04-12 LAB — BASIC METABOLIC PANEL
ANION GAP: 9 (ref 5–15)
BUN: 20 mg/dL (ref 6–20)
CALCIUM: 9 mg/dL (ref 8.9–10.3)
CO2: 27 mmol/L (ref 22–32)
Chloride: 106 mmol/L (ref 101–111)
Creatinine, Ser: 0.83 mg/dL (ref 0.44–1.00)
GFR calc Af Amer: >60 mL/min (ref >60)
GFR calc non Af Amer: >60 mL/min (ref >60)
GLUCOSE: 119 mg/dL — AB (ref 65–99)
Potassium: 3.3 mmol/L — ABNORMAL LOW (ref 3.5–5.1)
Sodium: 142 mmol/L (ref 135–145)

## 2015-04-12 LAB — TYPE AND SCREEN
ABO/RH(D): O NEG
Antibody Screen: NEGATIVE

## 2015-04-12 LAB — CBC
HCT: 39.8 % (ref 35.0–47.0)
Hemoglobin: 13.3 g/dL (ref 12.0–16.0)
MCH: 30.1 pg (ref 26.0–34.0)
MCHC: 33.4 g/dL (ref 32.0–36.0)
MCV: 90.1 fL (ref 80.0–100.0)
Platelets: 173 10*3/uL (ref 150–440)
RBC: 4.41 MIL/uL (ref 3.80–5.20)
RDW: 12.8 % (ref 11.5–14.5)
WBC: 3.8 10*3/uL (ref 3.6–11.0)

## 2015-04-12 LAB — ABO/RH: ABO/RH(D): O NEG

## 2015-04-12 LAB — PROTIME-INR
INR: 0.9
Prothrombin Time: 12.4 seconds (ref 11.4–15.0)

## 2015-04-12 NOTE — Pre-Procedure Instructions (Signed)
Potassium results called and faxed to Dr. Sabra Heck office, and asked to treat patient before surgery.

## 2015-04-12 NOTE — Patient Instructions (Addendum)
  Your procedure is scheduled on: April 19, 2015(Wednesday) Report to Day Surgery.[facility]) To find out your arrival time please call [phone] between 1PM - 3PM on April 18, 2015(Tuesday)  Remember: Instructions that are not followed completely may result in serious medical risk, up to and including death, or upon the discretion of your surgeon and anesthesiologist your surgery may need to be rescheduled.    __x__ 1. Do not eat food or drink liquids after midnight. No gum chewing or hard candies.     ____ 2. No Alcohol for 24 hours before or after surgery.   ____ 3. Bring all medications with you on the day of surgery if instructed.    __x_ 4. Notify your doctor if there is any change in your medical condition     (cold, fever, infections).     Do not wear jewelry, make-up, hairpins, clips or nail polish.  Do not wear lotions, powders, or perfumes. You may wear deodorant.  Do not shave 48 hours prior to surgery. Men may shave face and neck.  Do not bring valuables to the hospital.    [facility] is not responsible for any belongings or valuables.               Contacts, dentures or bridgework may not be worn into surgery.  Leave your suitcase in the car. After surgery it may be brought to your room.  For patients admitted to the hospital, discharge time is determined by your                treatment team.   Patients discharged the day of surgery will not be allowed to drive home.   Please read over the following fact sheets that you were given:   MRSA Information and Surgical Site Infection Prevention   __x __ Take these medicines the morning of surgery with A SIP OF WATER:    1. Effexor   ____ Fleet Enema (as directed)   __x__ Use CHG Soap as directed  ____ Use inhalers on the day of surgery  ____ Stop metformin 2 days prior to surgery    ____ Take 1/2 of usual insulin dose the night before surgery and none on the morning of surgery.   ____ Stop  Coumadin/Plavix/aspirin on   __x__ Stop Anti-inflammatories on (Tylenol ok to take for pain)   ____ Stop supplements until after surgery.    ____ Bring C-Pap to the hospital.

## 2015-04-13 ENCOUNTER — Encounter
Admission: RE | Admit: 2015-04-13 | Discharge: 2015-04-13 | Disposition: A | Discharge status: Home / Self Care | Payer: 59 | Source: Ambulatory Visit | Attending: Specialist | Admitting: Specialist

## 2015-04-13 ENCOUNTER — Other Ambulatory Visit: Payer: Self-pay | Admitting: Oncology

## 2015-04-13 ENCOUNTER — Encounter (INDEPENDENT_AMBULATORY_CARE_PROVIDER_SITE_OTHER): Payer: Self-pay

## 2015-04-13 ENCOUNTER — Ambulatory Visit
Admission: RE | Admit: 2015-04-13 | Discharge: 2015-04-13 | Disposition: A | Discharge status: Home / Self Care | Payer: 59 | Source: Ambulatory Visit | Attending: Oncology | Admitting: Oncology

## 2015-04-13 DIAGNOSIS — E7800 Pure hypercholesterolemia, unspecified: Secondary | ICD-10-CM | POA: Insufficient documentation

## 2015-04-13 DIAGNOSIS — C50911 Malignant neoplasm of unspecified site of right female breast: Secondary | ICD-10-CM | POA: Diagnosis not present

## 2015-04-13 DIAGNOSIS — C50919 Malignant neoplasm of unspecified site of unspecified female breast: Secondary | ICD-10-CM

## 2015-04-13 HISTORY — DX: Malignant neoplasm of unspecified site of unspecified female breast: C50.919

## 2015-04-13 LAB — SURGICAL PCR SCREEN
MRSA, PCR: NEGATIVE
STAPHYLOCOCCUS AUREUS: NEGATIVE

## 2015-04-13 NOTE — Pre-Procedure Instructions (Signed)
EKG to anesthesia for review. 

## 2015-04-17 NOTE — OR Nursing (Signed)
Chart returned from anesthesia review, EKG OK.

## 2015-04-18 NOTE — H&P (Signed)
TOTAL KNEE ADMISSION H&P  Patient is being admitted for left total knee arthroplasty.  Subjective:  Chief Complaint: Left  knee pain.  HPI: CHIRSTINE Mcpherson, 59 y.o. female, has a history of pain and functional disability in the left  knee due to arthritis and has failed non-surgical conservative treatments for greater than 12 weeks to includeNSAID's and/or analgesics, corticosteriod injections, use of assistive devices and activity modification.  Onset of symptoms was gradual, starting 5 years ago with gradually worsening course since that time. The patient noted no past surgery on the left knee(s).  Patient currently rates pain in the left knee(s) at 8 out of 10 with activity. Patient has night pain, worsening of pain with activity and weight bearing, pain that interferes with activities of daily living and crepitus.  Patient has evidence of subchondral cysts, subchondral sclerosis, periarticular osteophytes and joint space narrowing by imaging studies. This patient has had no prior problems. There is no active infection.  There are no active problems to display for this patient.  Past Medical History  Diagnosis Date  . Thyroid disease   . GERD (gastroesophageal reflux disease)   . Hypothyroidism   . Cancer (HCC)     breast  . Hypercholesteremia   . Depression   . Anxiety   . Chronic kidney disease     UTI  . Ankle edema   . Breast cancer [facility]) 2009    right breast, chemo and radiation    Past Surgical History  Procedure Laterality Date  . Abdominal hysterectomy    . Eye surgery      cataracts  . Surgery on heel, foot elbow    . Evacuation of hematoma right breast    . Colonoscopy with propofol N/A 01/19/2015    Procedure: COLONOSCOPY WITH PROPOFOL;  Surgeon: Josefine Class, MD;  Location: [facility] ENDOSCOPY;  Service: Endoscopy;  Laterality: N/A;  . Joint replacement Right July 14, 2013    Right Knee Replacement  . Retinal detachment surgery Left September 2007  . Breast  surgery Right Oct 23, 2007    Lumpectomy  . Breast excisional biopsy Right 2009    positive  . Breast biopsy Right 2011    negative, stereo  . Breast biopsy Right 2014    negative, stereo    No prescriptions prior to admission   No Known Allergies  Social History  Substance Use Topics  . Smoking status: Passive Smoke Exposure - Never Smoker  . Smokeless tobacco: Never Used  . Alcohol Use: No    No family history on file.   Review of Systems  Constitutional: Negative.   HENT: Negative.   Eyes: Negative.   Respiratory: Negative.   Cardiovascular: Negative.   Gastrointestinal: Negative.   Genitourinary: Negative.   Skin: Negative.   All other systems reviewed and are negative.   Objective:  Physical Exam  Vitals reviewed. Constitutional: She is oriented to person, place, and time. She appears well-developed and well-nourished.  Eyes: Pupils are equal, round, and reactive to light.  Neck: Normal range of motion.  Cardiovascular: Normal rate and regular rhythm.   Respiratory: Effort normal.  GI: Soft.  Musculoskeletal:  Left knee rom 10-90*.  Tender at joint lines.  NV good distally.  Skin intact.  Right TKR satisfactory.  Hip with good rom.    Neurological: She is alert and oriented to person, place, and time. She has normal reflexes.  Skin: Skin is warm.  Psychiatric: She has a normal mood and affect.  Her behavior is normal.    Vital signs in last 24 hours:    Labs:   Estimated body mass index is 39.09 kg/(m^2) as calculated from the following:   Height as of 06/14/14: 5\' 8"  (1.727 m).   Weight as of 06/14/14: 116.574 kg (257 lb).   Imaging Review Plain radiographs demonstrate severe degenerative joint disease of the left knee(s). The overall alignment ismild varus. The bone quality appears to be excellent for age and reported activity level.  Assessment/Plan:  End stage arthritis, left knee   The patient history, physical examination, clinical judgment  of the provider and imaging studies are consistent with end stage degenerative joint disease of the left knee(s) and total knee arthroplasty is deemed medically necessary. The treatment options including medical management, injection therapy arthroscopy and arthroplasty were discussed at length. The risks and benefits of total knee arthroplasty were presented and reviewed. The risks due to aseptic loosening, infection, stiffness, patella tracking problems, thromboembolic complications and other imponderables were discussed. The patient acknowledged the explanation, agreed to proceed with the plan and consent was signed. Patient is being admitted for inpatient treatment for surgery, pain control, PT, OT, prophylactic antibiotics, VTE prophylaxis, progressive ambulation and ADL's and discharge planning. The patient is planning to be discharged home with home health services

## 2015-04-19 ENCOUNTER — Encounter
Admission: AD | Disposition: A | Discharge status: Home Health | Payer: Self-pay | Source: Ambulatory Visit | Attending: Specialist

## 2015-04-19 ENCOUNTER — Inpatient Hospital Stay: Payer: 59 | Admitting: Anesthesiology

## 2015-04-19 ENCOUNTER — Encounter: Payer: Self-pay | Admitting: *Deleted

## 2015-04-19 ENCOUNTER — Inpatient Hospital Stay
Admission: AD | Admit: 2015-04-19 | Discharge: 2015-04-22 | DRG: 470 | Disposition: A | Discharge status: Home Health | Payer: 59 | Source: Ambulatory Visit | Attending: Specialist | Admitting: Specialist

## 2015-04-19 ENCOUNTER — Inpatient Hospital Stay: Payer: 59

## 2015-04-19 DIAGNOSIS — E039 Hypothyroidism, unspecified: Secondary | ICD-10-CM | POA: Diagnosis present

## 2015-04-19 DIAGNOSIS — N189 Chronic kidney disease, unspecified: Secondary | ICD-10-CM | POA: Diagnosis present

## 2015-04-19 DIAGNOSIS — F419 Anxiety disorder, unspecified: Secondary | ICD-10-CM | POA: Diagnosis present

## 2015-04-19 DIAGNOSIS — Z853 Personal history of malignant neoplasm of breast: Secondary | ICD-10-CM

## 2015-04-19 DIAGNOSIS — E7800 Pure hypercholesterolemia, unspecified: Secondary | ICD-10-CM | POA: Diagnosis present

## 2015-04-19 DIAGNOSIS — K219 Gastro-esophageal reflux disease without esophagitis: Secondary | ICD-10-CM | POA: Diagnosis present

## 2015-04-19 DIAGNOSIS — Z96651 Presence of right artificial knee joint: Secondary | ICD-10-CM | POA: Diagnosis present

## 2015-04-19 DIAGNOSIS — M1712 Unilateral primary osteoarthritis, left knee: Secondary | ICD-10-CM | POA: Diagnosis present

## 2015-04-19 DIAGNOSIS — F329 Major depressive disorder, single episode, unspecified: Secondary | ICD-10-CM | POA: Diagnosis present

## 2015-04-19 DIAGNOSIS — Z96659 Presence of unspecified artificial knee joint: Secondary | ICD-10-CM

## 2015-04-19 HISTORY — PX: TOTAL KNEE ARTHROPLASTY: SHX125

## 2015-04-19 LAB — CBC
HEMATOCRIT: 40 % (ref 35.0–47.0)
Hemoglobin: 13.3 g/dL (ref 12.0–16.0)
MCH: 30.3 pg (ref 26.0–34.0)
MCHC: 33.3 g/dL (ref 32.0–36.0)
MCV: 90.8 fL (ref 80.0–100.0)
Platelets: 199 10*3/uL (ref 150–440)
RBC: 4.4 MIL/uL (ref 3.80–5.20)
RDW: 12.8 % (ref 11.5–14.5)
WBC: 7.8 10*3/uL (ref 3.6–11.0)

## 2015-04-19 LAB — POCT I-STAT 4, (NA,K, GLUC, HGB,HCT)
Glucose, Bld: 114 mg/dL — ABNORMAL HIGH (ref 65–99)
HCT: 41 % (ref 36.0–46.0)
HEMOGLOBIN: 13.9 g/dL (ref 12.0–15.0)
Potassium: 4.2 mmol/L (ref 3.5–5.1)
SODIUM: 140 mmol/L (ref 135–145)

## 2015-04-19 LAB — CREATININE, SERUM
Creatinine, Ser: 0.77 mg/dL (ref 0.44–1.00)
GFR calc non Af Amer: >60 mL/min (ref >60)

## 2015-04-19 SURGERY — ARTHROPLASTY, KNEE, TOTAL
Anesthesia: Spinal | Laterality: Left

## 2015-04-19 MED ORDER — ACETAMINOPHEN 650 MG RE SUPP: 650.0000 mg | Freq: Four times a day (QID) | RECTAL | Status: DC | PRN

## 2015-04-19 MED ORDER — TRANEXAMIC ACID 1000 MG/10ML IV SOLN
1000.0000 mg | INTRAVENOUS | Status: AC
  Filled 2015-04-19: qty 10

## 2015-04-19 MED ORDER — SENNA 8.6 MG PO TABS
1.0000 | ORAL_TABLET | Freq: Two times a day (BID) | ORAL | Status: DC
  Administered 2015-04-19 – 2015-04-22 (×7): 8.6 mg via ORAL
  Filled 2015-04-19 (×7): qty 1

## 2015-04-19 MED ORDER — PREGABALIN 75 MG PO CAPS
ORAL_CAPSULE | ORAL | Status: AC
Start: 2015-04-19 — End: 2015-04-19
  Administered 2015-04-19: 75 mg via ORAL
  Filled 2015-04-19: qty 1

## 2015-04-19 MED ORDER — LACTATED RINGERS IV SOLN
INTRAVENOUS | Status: DC
  Administered 2015-04-19 (×3): via INTRAVENOUS

## 2015-04-19 MED ORDER — HYDROCODONE-ACETAMINOPHEN 7.5-325 MG PO TABS
1.0000 | ORAL_TABLET | Freq: Four times a day (QID) | ORAL | Status: DC
  Administered 2015-04-19 – 2015-04-22 (×11): 1 via ORAL
  Filled 2015-04-19 (×11): qty 1

## 2015-04-19 MED ORDER — LEVOTHYROXINE SODIUM 112 MCG PO TABS
112.0000 ug | ORAL_TABLET | Freq: Every day | ORAL | Status: DC
  Administered 2015-04-20 – 2015-04-22 (×3): 112 ug via ORAL
  Filled 2015-04-19 (×3): qty 1

## 2015-04-19 MED ORDER — FAMOTIDINE 20 MG PO TABS
ORAL_TABLET | ORAL | Status: AC
Start: 2015-04-19 — End: 2015-04-19
  Administered 2015-04-19: 20 mg via ORAL
  Filled 2015-04-19: qty 1

## 2015-04-19 MED ORDER — BUPIVACAINE-EPINEPHRINE (PF) 0.25% -1:200000 IJ SOLN
INTRAMUSCULAR | Status: AC
  Filled 2015-04-19: qty 60

## 2015-04-19 MED ORDER — OXYCODONE HCL 5 MG PO TABS
5.0000 mg | ORAL_TABLET | ORAL | Status: DC | PRN
  Administered 2015-04-19 – 2015-04-22 (×7): 5 mg via ORAL
  Filled 2015-04-19 (×7): qty 1

## 2015-04-19 MED ORDER — MAGNESIUM OXIDE 400 (241.3 MG) MG PO TABS
400.0000 mg | ORAL_TABLET | Freq: Every day | ORAL | Status: DC
  Administered 2015-04-19 – 2015-04-22 (×4): 400 mg via ORAL
  Filled 2015-04-19 (×4): qty 1

## 2015-04-19 MED ORDER — CELECOXIB 200 MG PO CAPS
200.0000 mg | ORAL_CAPSULE | Freq: Every day | ORAL | Status: DC
  Administered 2015-04-20: 200 mg via ORAL

## 2015-04-19 MED ORDER — FENTANYL CITRATE (PF) 100 MCG/2ML IJ SOLN
INTRAMUSCULAR | Status: AC
  Filled 2015-04-19: qty 2

## 2015-04-19 MED ORDER — MORPHINE SULFATE (PF) 0.5 MG/ML IJ SOLN
INTRAMUSCULAR | Status: AC
  Filled 2015-04-19: qty 10

## 2015-04-19 MED ORDER — ATORVASTATIN CALCIUM 10 MG PO TABS
10.0000 mg | ORAL_TABLET | Freq: Every day | ORAL | Status: DC
  Administered 2015-04-19 – 2015-04-21 (×3): 10 mg via ORAL
  Filled 2015-04-19 (×3): qty 1

## 2015-04-19 MED ORDER — VENLAFAXINE HCL ER 75 MG PO CP24
150.0000 mg | ORAL_CAPSULE | Freq: Every day | ORAL | Status: DC
  Administered 2015-04-20 – 2015-04-22 (×3): 150 mg via ORAL
  Filled 2015-04-19 (×3): qty 2

## 2015-04-19 MED ORDER — METOCLOPRAMIDE HCL 5 MG PO TABS: 5.0000 mg | ORAL_TABLET | Freq: Three times a day (TID) | ORAL | Status: DC | PRN

## 2015-04-19 MED ORDER — PANTOPRAZOLE SODIUM 40 MG PO TBEC
40.0000 mg | DELAYED_RELEASE_TABLET | Freq: Every day | ORAL | Status: DC
  Administered 2015-04-20 – 2015-04-22 (×3): 40 mg via ORAL
  Filled 2015-04-19 (×3): qty 1

## 2015-04-19 MED ORDER — MORPHINE SULFATE (PF) 0.5 MG/ML IJ SOLN
INTRAMUSCULAR | Status: DC | PRN
  Administered 2015-04-19: .2 mg via EPIDURAL

## 2015-04-19 MED ORDER — ONDANSETRON HCL 4 MG PO TABS: 4.0000 mg | ORAL_TABLET | Freq: Four times a day (QID) | ORAL | Status: DC | PRN

## 2015-04-19 MED ORDER — CELECOXIB 200 MG PO CAPS
200.0000 mg | ORAL_CAPSULE | Freq: Every day | ORAL | Status: DC
  Filled 2015-04-19 (×2): qty 1

## 2015-04-19 MED ORDER — SODIUM CHLORIDE 0.9 % IJ SOLN
INTRAMUSCULAR | Status: AC
  Filled 2015-04-19: qty 20

## 2015-04-19 MED ORDER — SODIUM CHLORIDE 0.9 % IJ SOLN
INTRAMUSCULAR | Status: AC
  Filled 2015-04-19: qty 3

## 2015-04-19 MED ORDER — VITAMIN D 1000 UNITS PO TABS
1000.0000 [IU] | ORAL_TABLET | Freq: Two times a day (BID) | ORAL | Status: DC
  Administered 2015-04-19 – 2015-04-22 (×6): 1000 [IU] via ORAL
  Filled 2015-04-19 (×7): qty 1

## 2015-04-19 MED ORDER — FLEET ENEMA 7-19 GM/118ML RE ENEM: 1.0000 | ENEMA | Freq: Once | RECTAL | Status: DC | PRN

## 2015-04-19 MED ORDER — PHENOL 1.4 % MT LIQD: 1.0000 | OROMUCOSAL | Status: DC | PRN

## 2015-04-19 MED ORDER — BISACODYL 10 MG RE SUPP: 10.0000 mg | Freq: Every day | RECTAL | Status: DC | PRN

## 2015-04-19 MED ORDER — BUPIVACAINE IN DEXTROSE 0.75-8.25 % IT SOLN
INTRATHECAL | Status: DC | PRN
  Administered 2015-04-19: 2 mL via INTRATHECAL

## 2015-04-19 MED ORDER — CELECOXIB 200 MG PO CAPS
400.0000 mg | ORAL_CAPSULE | Freq: Once | ORAL | Status: AC
  Administered 2015-04-19: 400 mg via ORAL

## 2015-04-19 MED ORDER — SODIUM CHLORIDE 0.9 % IV SOLN
1500.0000 mg | Freq: Two times a day (BID) | INTRAVENOUS | Status: AC
  Administered 2015-04-19: 1500 mg via INTRAVENOUS
  Filled 2015-04-19: qty 1500

## 2015-04-19 MED ORDER — METHOCARBAMOL 500 MG PO TABS
500.0000 mg | ORAL_TABLET | Freq: Four times a day (QID) | ORAL | Status: DC | PRN
Start: 2015-04-19 — End: 2015-04-22
  Administered 2015-04-21: 500 mg via ORAL
  Filled 2015-04-19: qty 1

## 2015-04-19 MED ORDER — FAMOTIDINE 20 MG PO TABS
20.0000 mg | ORAL_TABLET | Freq: Once | ORAL | Status: AC
  Administered 2015-04-19: 20 mg via ORAL

## 2015-04-19 MED ORDER — BUPIVACAINE LIPOSOME 1.3 % IJ SUSP
INTRAMUSCULAR | Status: AC
  Filled 2015-04-19: qty 20

## 2015-04-19 MED ORDER — METOCLOPRAMIDE HCL 5 MG/ML IJ SOLN: 5.0000 mg | Freq: Three times a day (TID) | INTRAMUSCULAR | Status: DC | PRN

## 2015-04-19 MED ORDER — ONDANSETRON HCL 4 MG/2ML IJ SOLN: 4.0000 mg | Freq: Four times a day (QID) | INTRAMUSCULAR | Status: DC | PRN

## 2015-04-19 MED ORDER — NEOMYCIN-POLYMYXIN B GU 40-200000 IR SOLN
Status: DC | PRN
  Administered 2015-04-19: 16 mL

## 2015-04-19 MED ORDER — PROPOFOL 10 MG/ML IV BOLUS
INTRAVENOUS | Status: DC | PRN
  Administered 2015-04-19: 30 mg via INTRAVENOUS

## 2015-04-19 MED ORDER — VANCOMYCIN HCL 10 G IV SOLR
1500.0000 mg | Freq: Once | INTRAVENOUS | Status: AC
  Administered 2015-04-19 (×2): 1500 mg via INTRAVENOUS
  Filled 2015-04-19: qty 1500

## 2015-04-19 MED ORDER — CELECOXIB 200 MG PO CAPS
ORAL_CAPSULE | ORAL | Status: AC
  Filled 2015-04-19: qty 2

## 2015-04-19 MED ORDER — PROPOFOL 500 MG/50ML IV EMUL
INTRAVENOUS | Status: DC | PRN
  Administered 2015-04-19: 80 ug/kg/min via INTRAVENOUS

## 2015-04-19 MED ORDER — ENOXAPARIN SODIUM 30 MG/0.3ML ~~LOC~~ SOLN
30.0000 mg | Freq: Two times a day (BID) | SUBCUTANEOUS | Status: DC
  Administered 2015-04-20 – 2015-04-22 (×5): 30 mg via SUBCUTANEOUS
  Filled 2015-04-19 (×5): qty 0.3

## 2015-04-19 MED ORDER — CELECOXIB 200 MG PO CAPS
200.0000 mg | ORAL_CAPSULE | Freq: Two times a day (BID) | ORAL | Status: DC
  Administered 2015-04-20 – 2015-04-22 (×4): 200 mg via ORAL
  Filled 2015-04-19 (×4): qty 1

## 2015-04-19 MED ORDER — SODIUM CHLORIDE 0.9 % IV SOLN
INTRAVENOUS | Status: DC | PRN
  Administered 2015-04-19: 40 mL

## 2015-04-19 MED ORDER — LORATADINE 10 MG PO TABS
10.0000 mg | ORAL_TABLET | Freq: Every day | ORAL | Status: DC
  Administered 2015-04-19 – 2015-04-22 (×4): 10 mg via ORAL
  Filled 2015-04-19 (×4): qty 1

## 2015-04-19 MED ORDER — ACETAMINOPHEN 325 MG PO TABS
650.0000 mg | ORAL_TABLET | Freq: Four times a day (QID) | ORAL | Status: DC | PRN
Start: 2015-04-19 — End: 2015-04-22

## 2015-04-19 MED ORDER — MIDAZOLAM HCL 5 MG/5ML IJ SOLN
INTRAMUSCULAR | Status: DC | PRN
  Administered 2015-04-19: 2 mg via INTRAVENOUS

## 2015-04-19 MED ORDER — FENTANYL CITRATE (PF) 100 MCG/2ML IJ SOLN
INTRAMUSCULAR | Status: DC | PRN
  Administered 2015-04-19: 100 ug via INTRAVENOUS

## 2015-04-19 MED ORDER — PREGABALIN 75 MG PO CAPS
75.0000 mg | ORAL_CAPSULE | Freq: Two times a day (BID) | ORAL | Status: DC
  Administered 2015-04-19 – 2015-04-22 (×6): 75 mg via ORAL
  Filled 2015-04-19 (×6): qty 1

## 2015-04-19 MED ORDER — PREGABALIN 75 MG PO CAPS
75.0000 mg | ORAL_CAPSULE | Freq: Once | ORAL | Status: AC
  Administered 2015-04-19: 75 mg via ORAL

## 2015-04-19 MED ORDER — FENTANYL CITRATE (PF) 100 MCG/2ML IJ SOLN
25.0000 ug | INTRAMUSCULAR | Status: AC | PRN
  Administered 2015-04-19 (×6): 25 ug via INTRAVENOUS

## 2015-04-19 MED ORDER — MORPHINE SULFATE (PF) 2 MG/ML IV SOLN
2.0000 mg | INTRAVENOUS | Status: DC | PRN
  Administered 2015-04-19: 2 mg via INTRAVENOUS
  Filled 2015-04-19: qty 1

## 2015-04-19 MED ORDER — NEOMYCIN-POLYMYXIN B GU 40-200000 IR SOLN
Status: AC
  Filled 2015-04-19: qty 20

## 2015-04-19 MED ORDER — DEXAMETHASONE SODIUM PHOSPHATE 10 MG/ML IJ SOLN
INTRAMUSCULAR | Status: DC | PRN
  Administered 2015-04-19: 5 mg via INTRAVENOUS

## 2015-04-19 MED ORDER — METHOCARBAMOL 1000 MG/10ML IJ SOLN
500.0000 mg | Freq: Four times a day (QID) | INTRAVENOUS | Status: DC | PRN
  Filled 2015-04-19: qty 5

## 2015-04-19 MED ORDER — TRANEXAMIC ACID 1000 MG/10ML IV SOLN
1000.0000 mg | INTRAVENOUS | Status: AC
  Administered 2015-04-19: 1000 mg via INTRAVENOUS
  Filled 2015-04-19: qty 10

## 2015-04-19 MED ORDER — ALUM & MAG HYDROXIDE-SIMETH 200-200-20 MG/5ML PO SUSP: 30.0000 mL | ORAL | Status: DC | PRN

## 2015-04-19 MED ORDER — ONDANSETRON HCL 4 MG/2ML IJ SOLN
INTRAMUSCULAR | Status: DC | PRN
  Administered 2015-04-19: 4 mg via INTRAVENOUS

## 2015-04-19 MED ORDER — PHENYLEPHRINE HCL 10 MG/ML IJ SOLN
INTRAMUSCULAR | Status: DC | PRN
  Administered 2015-04-19 (×5): 100 ug via INTRAVENOUS

## 2015-04-19 MED ORDER — ACETAMINOPHEN ER 650 MG PO TBCR: 1300.0000 mg | EXTENDED_RELEASE_TABLET | Freq: Two times a day (BID) | ORAL | Status: DC

## 2015-04-19 MED ORDER — MORPHINE SULFATE (PF) 4 MG/ML IV SOLN
INTRAVENOUS | Status: AC
  Filled 2015-04-19: qty 1

## 2015-04-19 MED ORDER — ACETAMINOPHEN 500 MG PO TABS: 1000.0000 mg | ORAL_TABLET | Freq: Four times a day (QID) | ORAL | Status: AC | PRN

## 2015-04-19 MED ORDER — DIPHENHYDRAMINE HCL 12.5 MG/5ML PO ELIX
12.5000 mg | ORAL_SOLUTION | ORAL | Status: DC | PRN
  Administered 2015-04-19: 12.5 mg via ORAL
  Administered 2015-04-19: 25 mg via ORAL
  Filled 2015-04-19: qty 10
  Filled 2015-04-19: qty 5

## 2015-04-19 MED ORDER — ONDANSETRON HCL 4 MG/2ML IJ SOLN: 4.0000 mg | Freq: Once | INTRAMUSCULAR | Status: DC | PRN

## 2015-04-19 MED ORDER — MAGNESIUM HYDROXIDE 400 MG/5ML PO SUSP
30.0000 mL | Freq: Every day | ORAL | Status: DC | PRN
  Administered 2015-04-21 (×2): 30 mL via ORAL
  Filled 2015-04-19 (×2): qty 30

## 2015-04-19 MED ORDER — MENTHOL 3 MG MT LOZG: 1.0000 | LOZENGE | OROMUCOSAL | Status: DC | PRN

## 2015-04-19 MED ORDER — SODIUM CHLORIDE 0.45 % IV SOLN
INTRAVENOUS | Status: DC
  Administered 2015-04-19: 12:00:00 via INTRAVENOUS

## 2015-04-19 MED ORDER — FERROUS SULFATE 325 (65 FE) MG PO TABS
325.0000 mg | ORAL_TABLET | Freq: Three times a day (TID) | ORAL | Status: DC
  Administered 2015-04-19 – 2015-04-22 (×8): 325 mg via ORAL
  Filled 2015-04-19 (×8): qty 1

## 2015-04-19 SURGICAL SUPPLY — 52 items
AUTOTRANSFUS HAS 1/8 (MISCELLANEOUS) ×3
BLADE DEBAKEY 8.0 (BLADE) ×2 IMPLANT
BLADE DEBAKEY 8.0MM (BLADE) ×1
BLADE SAGITTAL WIDE XTHICK NO (BLADE) ×3 IMPLANT
CANISTER SUCT 1200ML W/VALVE (MISCELLANEOUS) ×3 IMPLANT
CANISTER SUCT 3000ML (MISCELLANEOUS) ×3 IMPLANT
CAP KNEE TOTAL 3 SIGMA ×3 IMPLANT
CATH TRAY METER 16FR LF (MISCELLANEOUS) ×3 IMPLANT
CEMENT HV SMART SET (Cement) ×6 IMPLANT
CHLORAPREP W/TINT 26ML (MISCELLANEOUS) ×6 IMPLANT
COOLER POLAR GLACIER W/PUMP (MISCELLANEOUS) ×3 IMPLANT
DRAPE INCISE IOBAN 66X60 STRL (DRAPES) ×3 IMPLANT
DRAPE SHEET LG 3/4 BI-LAMINATE (DRAPES) ×3 IMPLANT
DRSG AQUACEL AG ADV 3.5X10 (GAUZE/BANDAGES/DRESSINGS) IMPLANT
DRSG AQUACEL AG ADV 3.5X14 (GAUZE/BANDAGES/DRESSINGS) ×3 IMPLANT
GLOVE BIO SURGEON STRL SZ 6.5 (GLOVE) ×4 IMPLANT
GLOVE BIO SURGEONS STRL SZ 6.5 (GLOVE) ×2
GLOVE BIOGEL PI IND STRL 7.0 (GLOVE) ×2 IMPLANT
GLOVE BIOGEL PI INDICATOR 7.0 (GLOVE) ×4
GLOVE SURG ORTHO 8.0 STRL STRW (GLOVE) ×3 IMPLANT
GLOVE SURG ORTHO 8.5 STRL (GLOVE) ×6 IMPLANT
GOWN STRL REUS W/ TWL LRG LVL4 (GOWN DISPOSABLE) ×3 IMPLANT
GOWN STRL REUS W/TWL LRG LVL4 (GOWN DISPOSABLE) ×6
HANDPIECE SUCTION TUBG SURGILV (MISCELLANEOUS) ×3 IMPLANT
IMMBOLIZER KNEE 19 BLUE UNIV (SOFTGOODS) ×3 IMPLANT
KIT RM TURNOVER STRD PROC AR (KITS) ×3 IMPLANT
NEEDLE 18GX1X1/2 (RX/OR ONLY) (NEEDLE) ×3 IMPLANT
NEEDLE SPNL 18GX3.5 QUINCKE PK (NEEDLE) ×3 IMPLANT
NS IRRIG 1000ML POUR BTL (IV SOLUTION) ×3 IMPLANT
PACK TOTAL KNEE (MISCELLANEOUS) ×3 IMPLANT
PAD GROUND ADULT SPLIT (MISCELLANEOUS) ×3 IMPLANT
PAD WRAPON POLAR KNEE (MISCELLANEOUS) ×1 IMPLANT
SOL .9 NS 3000ML IRR  AL (IV SOLUTION) ×2
SOL .9 NS 3000ML IRR UROMATIC (IV SOLUTION) ×1 IMPLANT
SPONGE LAP 18X18 5 PK (GAUZE/BANDAGES/DRESSINGS) IMPLANT
STAPLER SKIN PROX 35W (STAPLE) ×3 IMPLANT
SUCTION FRAZIER TIP 10 FR DISP (SUCTIONS) ×3 IMPLANT
SUT BONE WAX W31G (SUTURE) ×3 IMPLANT
SUT DVC 2 QUILL PDO  T11 36X36 (SUTURE) ×2
SUT DVC 2 QUILL PDO T11 36X36 (SUTURE) ×1 IMPLANT
SUT ORTHOCORD OS-6 NDL 36 (SUTURE) ×3 IMPLANT
SUT QUILL PDO 0 36 36 VIOLET (SUTURE) ×3 IMPLANT
SUT VIC AB 0 CT1 36 (SUTURE) ×3 IMPLANT
SUT VIC AB 2-0 CT1 (SUTURE) ×3 IMPLANT
SYR 20CC LL (SYRINGE) ×6 IMPLANT
SYR 50ML LL SCALE MARK (SYRINGE) ×6 IMPLANT
SYSTEM AUTOTRANSFUS DUAL TROCR (MISCELLANEOUS) ×1 IMPLANT
TAPE MICROFOAM 4IN (TAPE) ×3 IMPLANT
TOWER CARTRIDGE SMART MIX (DISPOSABLE) ×3 IMPLANT
TUBE SUCT KAM VAC (TUBING) ×3 IMPLANT
UPCHARGE REV TRAY MBT KNEE ×3 IMPLANT
WRAPON POLAR PAD KNEE (MISCELLANEOUS) ×3

## 2015-04-19 NOTE — Anesthesia Postprocedure Evaluation (Signed)
  Anesthesia Post-op Note  Patient: Heather Mcpherson  Procedure(s) Performed: Procedure(s): TOTAL KNEE ARTHROPLASTY (Left)  Anesthesia type:Spinal  Patient location: PACU  Post pain: Pain level controlled  Post assessment: Post-op Vital signs reviewed, Patient's Cardiovascular Status Stable, Respiratory Function Stable, Patent Airway and No signs of Nausea or vomiting  Post vital signs: Reviewed and stable  Last Vitals:  Filed Vitals:   04/19/15 1054  BP: 127/86  Pulse: 93  Temp: 36.3 C  Resp: 14    Level of consciousness: awake, alert  and patient cooperative  Complications: No apparent anesthesia complications

## 2015-04-19 NOTE — Evaluation (Signed)
Physical Therapy Evaluation Patient Details Name: Heather Mcpherson MRN: [id number] DOB: 1955/10/27 Today's Date: 04/19/2015   History of Present Illness  Pt was admitted to the hospital s/p L TKR on 04/19/15  Clinical Impression  Pt presents with hx of GERD, cancer, depression, anxiety, and CKD. Examination reveals that pt performs bed mobility and transfers at min A, and ambulation of 20 ft at [facility]. KI was donned at all times per MD orders. Pt shows very good strength in surrounding extremities and trunk performing bed mobility and transfers. She is very stable in standing with no unsteadiness or buckling. Pt is very pleasant to work with and she also had this same surgery performed back in 2015, so she is understanding of the process. Due to her strength, ROM, and gait deficits, she will continue to benefit from skilled PT in order for her to return home safely.     Follow Up Recommendations Home health PT;Supervision - Intermittent    Equipment Recommendations  None recommended by PT    Recommendations for Other Services       Precautions / Restrictions Precautions Precautions: Fall Required Braces or Orthoses: Knee Immobilizer - Left Knee Immobilizer - Left: On at all times Restrictions Weight Bearing Restrictions: Yes Other Position/Activity Restrictions: PWB      Mobility  Bed Mobility Overal bed mobility: Needs Assistance Bed Mobility: Supine to Sit     Supine to sit: Min assist     General bed mobility comments: Pt with good functional strength and use of side rails. She is very strong in surrounding extremities and trunk to assist getting to EOB. Needs minor assist for LLE management  Transfers Overall transfer level: Needs assistance Equipment used: Rolling walker (2 wheeled) Transfers: Sit to/from Stand Sit to Stand: Min assist         General transfer comment: Needs minor assist for getting into standing. Pt needs cues on hand placement but is stable with  transfer and in standing.   Ambulation/Gait Ambulation/Gait assistance: Min guard Ambulation Distance (Feet): 20 Feet Assistive device: Rolling walker (2 wheeled) Gait Pattern/deviations: Step-to pattern;Decreased step length - right;Decreased step length - left;Decreased stride length Gait velocity: decreased Gait velocity interpretation: Below normal speed for age/gender General Gait Details: Pt able to ambulate with increased time and requiring cues for sequencing of RW with gait. She is very stable with ambulation. She c/o some discomfort after about 20 total feet and wishes to sit down.  Stairs            Wheelchair Mobility    Modified Rankin (Stroke Patients Only)       Balance Overall balance assessment: No apparent balance deficits (not formally assessed)                                           Pertinent Vitals/Pain Pain Assessment: 0-10 Pain Score: 7  Pain Location: L knee Pain Intervention(s): Limited activity within patient's tolerance;Monitored during session;Premedicated before session;Ice applied    Home Living Family/patient expects to be discharged to:: Private residence Living Arrangements: Spouse/significant other Available Help at Discharge: Family;Available 24 hours/day Type of Home: Mobile home Home Access: Ramped entrance     Home Layout: One level Home Equipment: Walker - 2 wheels      Prior Function Level of Independence: Independent         Comments: Pt was indep with ambulation,  driving and ADLs. Pt was recreationally active playing golf regularly. Pt had same surgery on R side in January of 2015     Hand Dominance        Extremity/Trunk Assessment   Upper Extremity Assessment: Overall WFL for tasks assessed           Lower Extremity Assessment: LLE deficits/detail   LLE Deficits / Details: Gross MMT 3/5 in LLE. (able to complete full AROM)     Communication   Communication: No difficulties   Cognition Arousal/Alertness: Awake/alert Behavior During Therapy: WFL for tasks assessed/performed Overall Cognitive Status: Within Functional Limits for tasks assessed                      General Comments      Exercises Total Joint Exercises Goniometric ROM: L knee AAROM deferred until POD 1 secondary to pain Other Exercises Other Exercises: Pt performed bilateral therex x 10 reps at min A for facilitation of movement. Exercises included: ankle pumps, quad sets, glute sets, hip abd, SLR, and SAQ      Assessment/Plan    PT Assessment Patient needs continued PT services  PT Diagnosis Difficulty walking;Abnormality of gait;Acute pain   PT Problem List Decreased strength;Decreased range of motion;Decreased activity tolerance;Decreased mobility;Pain  PT Treatment Interventions DME instruction;Gait training;Functional mobility training;Stair training;Therapeutic activities;Therapeutic exercise;Balance training;Neuromuscular re-education   PT Goals (Current goals can be found in the Care Plan section) Acute Rehab PT Goals Patient Stated Goal: to return home PT Goal Formulation: With patient Time For Goal Achievement: 05/04/15 Potential to Achieve Goals: Good    Frequency BID   Barriers to discharge        Co-evaluation               End of Session Equipment Utilized During Treatment: Gait belt Activity Tolerance: Patient tolerated treatment well Patient left: in chair;with chair alarm set;with call bell/phone within reach;with SCD's reapplied Nurse Communication: Mobility status         Time: 1031-5945 PT Time Calculation (min) (ACUTE ONLY): 28 min   Charges:         PT G CodesJanyth Contes 05/08/15, 4:19 PM  Janyth Contes, SPT. [phone]

## 2015-04-19 NOTE — Anesthesia Procedure Notes (Signed)
Spinal Patient location during procedure: OR Start time: 04/19/2015 8:05 AM End time: 04/19/2015 8:14 AM Reason for block: at surgeon's request Staffing Anesthesiologist: Marline Backbone F Performed by: anesthesiologist  Preanesthetic Checklist Completed: patient identified, site marked, surgical consent, pre-op evaluation, timeout performed, IV checked, risks and benefits discussed, monitors and equipment checked and at surgeon's request Spinal Block Patient position: sitting Prep: Betadine Patient monitoring: heart rate and blood pressure Approach: midline Location: L4-5 Injection technique: single-shot Needle Needle type: Tuohy  Needle gauge: 25 G Needle length: 9 cm Needle insertion depth: 7 cm Assessment Sensory level: T8

## 2015-04-19 NOTE — Evaluation (Signed)
Physical Therapy Evaluation Patient Details Name: Heather Mcpherson MRN: [id number] DOB: 1955-12-27 Today's Date: 04/19/2015   History of Present Illness  Pt was admitted to the hospital s/p L TKR on 04/19/15  Clinical Impression  Pt presents with hx of GERD, cancer, depression, anxiety, and CKD. Examination reveals that pt performs bed mobility and transfers at min A, and ambulation of 20 ft at [facility]. KI was donned at all times per MD orders. Pt shows very good strength in surrounding extremities and trunk performing bed mobility and transfers. She is very stable in standing with no unsteadiness or buckling. Pt is very pleasant to work with and she also had this same surgery performed back in 2015, so she is understanding of the process. Due to her strength, ROM, and gait deficits, she will continue to benefit from skilled PT in order for her to return home safely.     Follow Up Recommendations Home health PT;Supervision - Intermittent    Equipment Recommendations  None recommended by PT    Recommendations for Other Services       Precautions / Restrictions Precautions Precautions: Fall Restrictions Weight Bearing Restrictions: Yes Other Position/Activity Restrictions: PWB      Mobility  Bed Mobility Overal bed mobility: Needs Assistance Bed Mobility: Supine to Sit     Supine to sit: Min assist     General bed mobility comments: Pt with good functional strength and use of side rails. She is very strong in surrounding extremities and trunk to assist getting to EOB. Needs minor assist for LLE management  Transfers Overall transfer level: Needs assistance Equipment used: Rolling walker (2 wheeled) Transfers: Sit to/from Stand Sit to Stand: Min assist         General transfer comment: Needs minor assist for getting into standing. Pt needs cues on hand placement but is stable with transfer and in standing.   Ambulation/Gait Ambulation/Gait assistance: Min guard Ambulation  Distance (Feet): 20 Feet Assistive device: Rolling walker (2 wheeled) Gait Pattern/deviations: Step-to pattern;Decreased step length - right;Decreased step length - left;Decreased stride length Gait velocity: decreased Gait velocity interpretation: Below normal speed for age/gender General Gait Details: Pt able to ambulate with increased time and requiring cues for sequencing of RW with gait. She is very stable with ambulation. She c/o some discomfort after about 20 total feet and wishes to sit down.  Stairs            Wheelchair Mobility    Modified Rankin (Stroke Patients Only)       Balance Overall balance assessment: No apparent balance deficits (not formally assessed)                                           Pertinent Vitals/Pain Pain Assessment: 0-10 Pain Score: 7  Pain Location: L knee Pain Intervention(s): Limited activity within patient's tolerance;Monitored during session;Premedicated before session;Ice applied    Home Living Family/patient expects to be discharged to:: Private residence Living Arrangements: Spouse/significant other Available Help at Discharge: Family;Available 24 hours/day Type of Home: Mobile home Home Access: Ramped entrance     Home Layout: One level Home Equipment: Walker - 2 wheels      Prior Function Level of Independence: Independent         Comments: Pt was indep with ambulation, driving and ADLs. Pt was recreationally active playing golf regularly. Pt had same surgery on R  side in January of 2015     Hand Dominance        Extremity/Trunk Assessment   Upper Extremity Assessment: Overall WFL for tasks assessed           Lower Extremity Assessment: LLE deficits/detail   LLE Deficits / Details: Gross MMT 3/5 in LLE. (able to complete full AROM)     Communication   Communication: No difficulties  Cognition Arousal/Alertness: Awake/alert Behavior During Therapy: WFL for tasks  assessed/performed Overall Cognitive Status: Within Functional Limits for tasks assessed                      General Comments      Exercises Total Joint Exercises Goniometric ROM: L knee AAROM deferred until POD 1 secondary to pain Other Exercises Other Exercises: Pt performed bilateral therex x 10 reps at min A for facilitation of movement. Exercises included: ankle pumps, quad sets, glute sets, hip abd, SLR, and SAQ      Assessment/Plan    PT Assessment Patient needs continued PT services  PT Diagnosis Difficulty walking;Abnormality of gait;Acute pain   PT Problem List Decreased strength;Decreased range of motion;Decreased activity tolerance;Decreased mobility;Pain  PT Treatment Interventions DME instruction;Gait training;Functional mobility training;Stair training;Therapeutic activities;Therapeutic exercise;Balance training;Neuromuscular re-education   PT Goals (Current goals can be found in the Care Plan section) Acute Rehab PT Goals Patient Stated Goal: to return home PT Goal Formulation: With patient Time For Goal Achievement: 05/04/15 Potential to Achieve Goals: Good    Frequency BID   Barriers to discharge        Co-evaluation               End of Session Equipment Utilized During Treatment: Gait belt Activity Tolerance: Patient tolerated treatment well Patient left: in chair;with chair alarm set;with call bell/phone within reach;with SCD's reapplied Nurse Communication: Mobility status         Time: 0459-9774 PT Time Calculation (min) (ACUTE ONLY): 28 min   Charges:         PT G CodesJanyth Contes 05/13/2015, 4:14 PM Janyth Contes, SPT. [phone]

## 2015-04-19 NOTE — Progress Notes (Signed)
Plan of care discussed with patient. Patient states that she would like to be awakening for pain medication. Patient demonstrates correct use of incentive spirometer.

## 2015-04-19 NOTE — H&P (Signed)
THE PATIENT WAS SEEN IN THE HOLDING AREA.  HISTORY, ALLERGIES, HOME MEDICATIONS AND OPERATIVE PROCEDURE WERE REVIEWED. RISKS AND BENEFITS OF SURGERY DISCUSSED WITH PATIENT AGAIN.  NO CHANGES FROM INITIAL HISTORY AND PHYSICAL NOTED.    

## 2015-04-19 NOTE — OR Nursing (Signed)
TENS rep called to connect unit, patient's heels off bed, polar care, foot pumps and bear hugger applied.

## 2015-04-19 NOTE — Care Management (Signed)
List of home health care agencies left with patient. RNCM will follow up with patient tomorrow.

## 2015-04-19 NOTE — Op Note (Signed)
DATE OF SURGERY:  04/19/2015 TIME: 10:46 AM  PATIENT NAME:  Heather Mcpherson   AGE: 59 y.o.    PRE-OPERATIVE DIAGNOSIS:  OSTEOARTHRITIS OF LEFT KNEE  POST-OPERATIVE DIAGNOSIS:  Same  PROCEDURE:  Procedure(s): TOTAL KNEE ARTHROPLASTY   SURGEON:  Lorna Strother E, MD   ASSISTANT:   OPERATIVE IMPLANTS: Depuy LCS Femur/Patella size Standard plus, Tibia size #3 MBT revision,  Rotating platform polyethylene size 12.5 mm.    Total tourniquet time was 113 minutes.  PREOPERATIVE INDICATIONS:  Heather Mcpherson is a 59 y.o. year old female with end stage bone on bone degenerative arthritis of the knee who failed conservative treatment, including injections, antiinflammatories, activity modification, and assistive devices, and had significant impairment of their activities of daily living, and elected for Total Knee Arthroplasty.   The risks, benefits, and alternatives were discussed at length including but not limited to the risks of infection, bleeding, nerve injury, stiffness, blood clots, the need for revision surgery, cardiopulmonary complications, among others, and they were willing to proceed.  OPERATIVE FINDINGS AND UNIQUE ASPECTS OF THE CASE:  Severe osteoarthritis with valgus and loose bodies  OPERATIVE DESCRIPTION:   The patient was brought to the operative room and placed in a supine position. Spinal anesthesia was administered. IV Vancomycin antibiotics were given. The lower extremity was prepped and draped in the usual sterile fashion. Time out was performed. The leg was elevated and exsanguinated and the tourniquet was inflated to 350 mmHg  An anterior midline incision was made.  Anterior quadriceps tendon splitting approach was performed. The patella was everted and osteophytes were removed. The anterior horn of the medial and lateral meniscus was removed.  Then the extramedullary tibial cutting jig was utilized making the appropriate cut using the anterior tibial crest as a  reference building in appropriate posterior slope. Care was taken during the cut to protect the medial and collateral ligaments. The proximal tibia was removed along with the posterior horns of the menisci. The PCL was sacrificed.  The distal femur was sized as a standard plus. Medial release was carried out. The anterior femoral cutting guide was aligned and centering hole made. The rotation guide was inserted and the anterior cutting guide pinned in place, and was in excellent alignment. The posterior femoral cuts were made. A 12.5 mm flexion gap was established. The distal femoral cutting guide was introduced at 4 of valgus. This was pinned and the distal femoral cut made. A 12.5 mm extension gap was established and was stable. The finishing guide was applied and finishing cuts made. The Mchale retractor was inserted and the MBT #3  tibial trial was pinned in place. Centering hole was made and the keel inserted. The femoral component was inserted along with a 12.5 mm  insert and the knee articulated.  Extension and flexion showed good stability throughout. The patella was then sized and cut made for the patellar component. Centering holes were made. The trial was inserted and the knee articulated nicely with no need for lateral release. The trials were all removed and the knee thoroughly irrigated with pulsed lavage. Exparil was injected. The knee was dried and the cement mixed. The #3 MBT keeled tibial component, standard plus femoral component and patellar components were all cemented in place and excess cement was removed. The cement was allowed to harden for 10 minutes. Further irrigated. Further irrigation was carried out. Bone wax was applied to all raw bony surfaces. Autovac drains were inserted. The capsule was closed with #2  Quill suture, and the subjacent tissues were closed with 0 Quill suture. The skin was closed with staples.Sponge and needle counts were correct.  Aquacel dressing with TENS pads  and a dry sterile dressing were applied. Polar Care and knee immobilizer were applied. Tourniquet was deflated with excellent return of blood flow to foot. Patient was transferred to a hospital bed and taken to the recovery room in good condition.  Park Breed, MD

## 2015-04-19 NOTE — Progress Notes (Signed)
Patient a&o, vss. Pain controlled. Dressing dry and intact on left knee. Autovac converted to hemovac. Teds and foot pumps in place. Family at bedside.

## 2015-04-19 NOTE — Anesthesia Preprocedure Evaluation (Signed)
Anesthesia Evaluation  Patient identified by MRN, date of birth, ID band Patient awake    Reviewed: Allergy & Precautions, NPO status , Patient's Chart, lab work & pertinent test results  Airway Mallampati: II       Dental no notable dental hx.    Pulmonary neg pulmonary ROS,    Pulmonary exam normal        Cardiovascular negative cardio ROS Normal cardiovascular exam     Neuro/Psych Anxiety Depression    GI/Hepatic Neg liver ROS, GERD  Medicated,  Endo/Other  Hypothyroidism   Renal/GU      Musculoskeletal negative musculoskeletal ROS (+)   Abdominal Normal abdominal exam  (+)   Peds negative pediatric ROS (+)  Hematology negative hematology ROS (+)   Anesthesia Other Findings   Reproductive/Obstetrics                             Anesthesia Physical Anesthesia Plan  ASA: II  Anesthesia Plan: Spinal   Post-op Pain Management:    Induction: Intravenous  Airway Management Planned: Nasal Cannula  Additional Equipment:   Intra-op Plan:   Post-operative Plan:   Informed Consent:   Plan Discussed with: CRNA  Anesthesia Plan Comments:         Anesthesia Quick Evaluation

## 2015-04-19 NOTE — Addendum Note (Signed)
Addendum  created 04/19/15 1257 by Iver Nestle, MD   Modules edited: Orders, PRL Based Order Sets

## 2015-04-19 NOTE — Transfer of Care (Signed)
Immediate Anesthesia Transfer of Care Note  Patient: Heather Mcpherson  Procedure(s) Performed: Procedure(s): TOTAL KNEE ARTHROPLASTY (Left)  Patient Location: PACU  Anesthesia Type:Spinal  Level of Consciousness: awake, alert  and oriented  Airway & Oxygen Therapy: Patient Spontanous Breathing and Patient connected to face mask oxygen  Post-op Assessment: Report given to RN and Post -op Vital signs reviewed and stable  Post vital signs: Reviewed and stable  Last Vitals:  Filed Vitals:   04/19/15 1054  BP: 127/86  Pulse: 93  Temp: 36.3 C  Resp: 14    Complications: No apparent anesthesia complications

## 2015-04-20 ENCOUNTER — Encounter: Payer: Self-pay | Admitting: Specialist

## 2015-04-20 LAB — BASIC METABOLIC PANEL
Anion gap: 6 (ref 5–15)
BUN: 13 mg/dL (ref 6–20)
CALCIUM: 8.9 mg/dL (ref 8.9–10.3)
CHLORIDE: 102 mmol/L (ref 101–111)
CO2: 30 mmol/L (ref 22–32)
CREATININE: 0.72 mg/dL (ref 0.44–1.00)
GFR calc Af Amer: >60 mL/min (ref >60)
GFR calc non Af Amer: >60 mL/min (ref >60)
GLUCOSE: 142 mg/dL — AB (ref 65–99)
Potassium: 3.9 mmol/L (ref 3.5–5.1)
Sodium: 138 mmol/L (ref 135–145)

## 2015-04-20 LAB — CBC
HEMATOCRIT: 36.2 % (ref 35.0–47.0)
HEMOGLOBIN: 12.6 g/dL (ref 12.0–16.0)
MCH: 31.7 pg (ref 26.0–34.0)
MCHC: 34.7 g/dL (ref 32.0–36.0)
MCV: 91.2 fL (ref 80.0–100.0)
Platelets: 198 10*3/uL (ref 150–440)
RBC: 3.97 MIL/uL (ref 3.80–5.20)
RDW: 12.8 % (ref 11.5–14.5)
WBC: 9.1 10*3/uL (ref 3.6–11.0)

## 2015-04-20 NOTE — Progress Notes (Signed)
Subjective: 1 Day Post-Op Procedure(s) (LRB): TOTAL KNEE ARTHROPLASTY (Left)    Patient reports pain as mild. Slept well and feels better than last knee.  Walked with PT.  Objective:   VITALS:   Filed Vitals:   04/20/15 1135  BP:   Pulse: 89  Temp:   Resp:     Neurologically intact Neurovascular intact Sensation intact distally Intact pulses distally Dorsiflexion/Plantar flexion intact Dressing dry.  Drain removed.   LABS  Recent Labs  04/19/15 0646 04/19/15 1518 04/20/15 0519  HGB 13.9 13.3 12.6  HCT 41.0 40.0 36.2  WBC  --  7.8 9.1  PLT  --  199 198     Recent Labs  04/19/15 0646 04/19/15 1518 04/20/15 0519  NA 140  --  138  K 4.2  --  3.9  BUN  --   --  13  CREATININE  --  0.77 0.72  GLUCOSE 114*  --  142*    No results for input(s): LABPT, INR in the last 72 hours.   Assessment/Plan: 1 Day Post-Op Procedure(s) (LRB): TOTAL KNEE ARTHROPLASTY (Left)   Advance diet Up with therapy Discharge home with home health Saturday

## 2015-04-20 NOTE — Evaluation (Signed)
Occupational Therapy Evaluation Patient Details Name: Heather Mcpherson MRN: [id number] DOB: May 01, 1956 Today's Date: 04/20/2015    History of Present Illness This patient is a 59 year old female who came to [facility] for a L TKR. 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    Clinical Impression   This patient is a 59 year old female who came to [facility] for a L total knee replacement.  Patient lives in a home with her husband.  She had been independent with ADL and functional mobility, drives and plays golf. She now requires some minimal assistance . She is familiar with the hip kit from last time, but did not need hip kit last time.      Follow Up Recommendations   (home with home health PT, no OT after discharge)    Equipment Recommendations       Recommendations for Other Services       Precautions / Restrictions Precautions Precautions: Fall Required Braces or Orthoses: Knee Immobilizer - Left Knee Immobilizer - Left: On at all times Restrictions Weight Bearing Restrictions: Yes Other Position/Activity Restrictions: PWB      Mobility Bed Mobility                  Transfers                      Balance                                            ADL                                         General ADL Comments: Patient had been independent with ADL and mobility and drives and plays golf. She is retired. Reviewed hip kit and practiced technique for lower body dressing using hip kit. Most likely she will not need hip kit in a few days.     Vision     Perception     Praxis      Pertinent Vitals/Pain       Hand Dominance     Extremity/Trunk Assessment Upper Extremity Assessment Upper Extremity Assessment: Overall WFL for tasks assessed   Lower Extremity Assessment Lower Extremity Assessment: Defer to PT evaluation       Communication Communication Communication: No  difficulties   Cognition Arousal/Alertness: Awake/alert Behavior During Therapy: WFL for tasks assessed/performed Overall Cognitive Status: Within Functional Limits for tasks assessed                     General Comments       Exercises       Shoulder Instructions      Home Living Family/patient expects to be discharged to:: Private residence Living Arrangements: Spouse/significant other Available Help at Discharge: Family;Available 24 hours/day Type of Home: Mobile home Home Access: Ramped entrance     Home Layout: One level               Home Equipment: Walker - 2 wheels          Prior Functioning/Environment Level of Independence: Independent             OT Diagnosis: Acute pain   OT Problem List: Decreased range  of motion;Decreased activity tolerance;Pain   OT Treatment/Interventions: Self-care/ADL training    OT Goals(Current goals can be found in the care plan section) Acute Rehab OT Goals Patient Stated Goal: to return home OT Goal Formulation: With patient Time For Goal Achievement: 05/04/15 Potential to Achieve Goals: Good  OT Frequency: Min 1X/week   Barriers to D/C:            Co-evaluation              End of Session Equipment Utilized During Treatment:  (hip kit)  Activity Tolerance:   Patient left: in bed;with bed alarm set;with call bell/phone within reach   Time: 0955-1010 OT Time Calculation (min): 15 min Charges:  OT General Charges $OT Visit: 1 Procedure OT Evaluation $Initial OT Evaluation Tier I: 1 Procedure G-Codes:    Myrene Galas, MS/OTR/L  04/20/2015, 10:19 AM

## 2015-04-20 NOTE — Progress Notes (Signed)
Physical Therapy Treatment Patient Details Name: Heather Mcpherson MRN: [id number] DOB: 30-Nov-1955 Today's Date: 04/20/2015    History of Present Illness This patient is a 59 year old female who came to [facility] for a L TKR. 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     PT Comments    Pt progressing nicely in all areas. Demonstrates good left QS, progressing left knee active assisted range of motion as well as ambulation distance. Pt demonstrates good safety awareness and compliance with left PWB. rolling walker adjusted for improved fit and use for left PWB with education on adjusting her personal rolling walker if needed. Pt/family education on correct technique, frequency, repetitions and duration for left knee flexion and extension stretching for optimal progressive range of motion. Plan to see pt this pm for continued ambulation and possible stair training. It is noted that pt also has a ramp to use with bilateral reachable rails that she may use in leu of stairs.   Follow Up Recommendations  Home health PT;Supervision - Intermittent     Equipment Recommendations  None recommended by PT    Recommendations for Other Services       Precautions / Restrictions Precautions Precautions: Fall Required Braces or Orthoses: Knee Immobilizer - Left Knee Immobilizer - Left: On at all times Restrictions Weight Bearing Restrictions: Yes Other Position/Activity Restrictions: PWB    Mobility  Bed Mobility               General bed mobility comments: Not tested; up in chair  Transfers Overall transfer level: Needs assistance Equipment used: Rolling walker (2 wheeled) Transfers: Sit to/from Stand Sit to Stand: Min guard         General transfer comment:  (Demonstrates good safety)  Ambulation/Gait Ambulation/Gait assistance: Min guard Ambulation Distance (Feet): 200 Feet Assistive device: Rolling walker (2 wheeled) Gait Pattern/deviations: Step-to pattern (PWB L with  immobilizer) Gait velocity: decreased Gait velocity interpretation: Below normal speed for age/gender General Gait Details: Rw adjusted for better ability to lean for L PWB; demonstrates compliance with weightbearing restriction. No balance issues noted   Stairs            Wheelchair Mobility    Modified Rankin (Stroke Patients Only)       Balance                                    Cognition Arousal/Alertness: Awake/alert Behavior During Therapy: WFL for tasks assessed/performed Overall Cognitive Status: Within Functional Limits for tasks assessed                      Exercises Total Joint Exercises Ankle Circles/Pumps: AROM;Both;20 reps (long sit) Quad Sets: Strengthening;20 reps;Seated;Left (with gravity assisted L knee extension stretch ) Knee Flexion: AAROM;Left;Other reps (comment) (10 sets of 4 position sefl assisted stretch) Goniometric ROM: 0-71 degrees AAROM    General Comments        Pertinent Vitals/Pain Pain Assessment: 0-10 Pain Score: 5  Pain Location: L knee Pain Descriptors / Indicators: Aching Pain Intervention(s): Monitored during session;Premedicated before session;Ice applied    Home Living Family/patient expects to be discharged to:: Private residence Living Arrangements: Spouse/significant other Available Help at Discharge: Family;Available 24 hours/day Type of Home: Mobile home Home Access: Ramped entrance   Home Layout: One level Home Equipment: Walker - 2 wheels      Prior Function Level of Independence: Independent  PT Goals (current goals can now be found in the care plan section) Acute Rehab PT Goals Patient Stated Goal: to return home Progress towards PT goals: Progressing toward goals    Frequency  BID    PT Plan Current plan remains appropriate    Co-evaluation             End of Session Equipment Utilized During Treatment: Left knee immobilizer Activity Tolerance: Patient  tolerated treatment well;No increased pain Patient left: in chair;with call bell/phone within reach;with chair alarm set;with family/visitor present (polar care applied)     Time: 1135-1209 PT Time Calculation (min) (ACUTE ONLY): 34 min  Charges:  $Gait Training: 8-22 mins $Therapeutic Exercise: 8-22 mins                    G Codes:      Charlaine Dalton 04/20/2015, 1:05 PM

## 2015-04-20 NOTE — Progress Notes (Signed)
Clinical Social Worker (CSW) received SNF consult. PT is recommending home health. RN Case Manager aware of above. Please reconsult if future social work needs arise. CSW signing off.   Arienne Gartin Morgan, LCSWA [phone] 

## 2015-04-20 NOTE — Progress Notes (Signed)
Physical Therapy Treatment Patient Details Name: Heather Mcpherson MRN: [id number] DOB: 18-Mar-1956 Today's Date: 04/20/2015    History of Present Illness This patient is a 59 year old female who came to [facility] for a L TKR. 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     PT Comments    Treatment attempted earlier this pm; MD had just removed drain and notes pt having some bleeding requesting PT be performed later. Second attempt, bleeding has stopped and pt notes fatigue this pm. Pain mildly increased; pt has had pain medicine and is able to have additional medication if needed post therapy. Agreed to hold stair training until tomorrow. Pt continues to demonstrate safe ambulation with good compliance with left PWB status and only supervision. Pt ambulates to/from bathroom and manages toileting with only set up. Pt returned to bed post session. Continue PT tomorrow for continued strengthening, ambulation and stair training to improve functional mobility for optimal safe return home post discharge.  Follow Up Recommendations  Home health PT;Supervision - Intermittent     Equipment Recommendations  None recommended by PT    Recommendations for Other Services       Precautions / Restrictions Precautions Precautions: Fall Required Braces or Orthoses: Knee Immobilizer - Left Knee Immobilizer - Left: On at all times Restrictions Weight Bearing Restrictions: Yes Other Position/Activity Restrictions: PWB    Mobility  Bed Mobility Overal bed mobility: Modified Independent Bed Mobility: Sit to Supine       Sit to supine: Modified independent (Device/Increase time)   General bed mobility comments: able to manage LLE self into bed; uses trapeze to reposition upward in bed  Transfers Overall transfer level: Needs assistance Equipment used: Rolling walker (2 wheeled) Transfers: Sit to/from Stand (from recliner and bedside commode) Sit to Stand: Min guard         General transfer  comment:  (Demonstrates good safety)  Ambulation/Gait Ambulation/Gait assistance: Supervision Ambulation Distance (Feet): 200 Feet Assistive device: Rolling walker (2 wheeled) Gait Pattern/deviations: Step-to pattern (PWB L) Gait velocity: decreased Gait velocity interpretation: Below normal speed for age/gender General Gait Details: Rw adjusted for better ability to lean for L PWB; demonstrates compliance with weightbearing restriction. No balance issues noted   Stairs            Wheelchair Mobility    Modified Rankin (Stroke Patients Only)       Balance                                    Cognition Arousal/Alertness: Awake/alert Behavior During Therapy: WFL for tasks assessed/performed Overall Cognitive Status: Within Functional Limits for tasks assessed                      Exercises Total Joint Exercises Ankle Circles/Pumps: AROM;Both;20 reps (long sit) Quad Sets: Strengthening;20 reps;Seated;Left (with gravity assisted L knee extension stretch ) Knee Flexion: AAROM;Left;Other reps (comment) (10 sets of 4 position sefl assisted stretch) Goniometric ROM: 0-71 degrees AAROM    General Comments        Pertinent Vitals/Pain Pain Assessment: 0-10 Pain Score: 6  Pain Location: L knee Pain Descriptors / Indicators: Aching Pain Intervention(s): Monitored during session;Repositioned;Ice applied    Home Living                      Prior Function  PT Goals (current goals can now be found in the care plan section) Progress towards PT goals: Progressing toward goals    Frequency  BID    PT Plan Current plan remains appropriate    Co-evaluation             End of Session Equipment Utilized During Treatment: Left knee immobilizer Activity Tolerance: Patient tolerated treatment well;Patient limited by fatigue Patient left: in bed;with call bell/phone within reach;with bed alarm set;with SCD's reapplied (polar  care applied)     Time: 1446-1510 PT Time Calculation (min) (ACUTE ONLY): 24 min  Charges:  $Gait Training: 8-22 mins $Therapeutic Exercise: 8-22 mins $Therapeutic Activity: 8-22 mins                    G Codes:      Charlaine Dalton 04/20/2015, 3:17 PM

## 2015-04-20 NOTE — Care Management Note (Addendum)
Case Management Note  Patient Details  Name: Heather Mcpherson MRN: [id number] Date of Birth: 10/30/1955  Subjective/Objective:                  Met with patient and her husband. She states she has help at home. She has a ramp, electric scooter, front-wheeled walker and denies need for a bedside commode. She has used [facility] in the past. She would like to use them again.    Action/Plan: List of home health care agencies left with patient/husband yesterday. Referral made to [facility] with [place]. RNCM will continue to follow.   Expected Discharge Date:                  Expected Discharge Plan:     In-House Referral:     Discharge planning Services  CM Consult  Post Acute Care Choice:  Home Health Choice offered to:  Patient  DME Arranged:    DME Agency:     HH Arranged:  PT [place]:  [place]  Status of Service:  In process, will continue to follow  Medicare Important Message Given:    Date Medicare IM Given:    Medicare IM give by:    Date Additional Medicare IM Given:    Additional Medicare Important Message give by:     If discussed at [place] of Stay Meetings, dates discussed:    Additional Comments: [facility] is out-of- network. [facility] is out-of-network.  Prior authorization needed from [place] [phone]. I spoke at length with [employer]. ICD-10 M17.12. Approved for 1 evaluation and  6 visits. They will arrange home health agency for HHPT. RNCM will need to fax orders to [facility]: [phone]. ID #[id number].    Marshell Garfinkel, RN 04/20/2015, 11:53 AM

## 2015-04-21 LAB — CBC
HEMATOCRIT: 33.8 % — AB (ref 35.0–47.0)
Hemoglobin: 11.7 g/dL — ABNORMAL LOW (ref 12.0–16.0)
MCH: 31 pg (ref 26.0–34.0)
MCHC: 34.6 g/dL (ref 32.0–36.0)
MCV: 89.6 fL (ref 80.0–100.0)
Platelets: 157 10*3/uL (ref 150–440)
RBC: 3.77 MIL/uL — AB (ref 3.80–5.20)
RDW: 12.8 % (ref 11.5–14.5)
WBC: 5.7 10*3/uL (ref 3.6–11.0)

## 2015-04-21 MED ORDER — GABAPENTIN 400 MG PO CAPS: 400.0000 mg | ORAL_CAPSULE | Freq: Three times a day (TID) | ORAL | Status: DC

## 2015-04-21 MED ORDER — HYDROCODONE-ACETAMINOPHEN 7.5-325 MG PO TABS: 1.0000 | ORAL_TABLET | Freq: Four times a day (QID) | ORAL | Status: DC

## 2015-04-21 MED ORDER — METHOCARBAMOL 500 MG PO TABS: 500.0000 mg | ORAL_TABLET | Freq: Four times a day (QID) | ORAL | Status: DC | PRN

## 2015-04-21 MED ORDER — ASPIRIN EC 325 MG PO TBEC: 325.0000 mg | DELAYED_RELEASE_TABLET | Freq: Two times a day (BID) | ORAL | Status: DC

## 2015-04-21 NOTE — Addendum Note (Signed)
Addendum  created 04/21/15 0905 by Doreen Salvage, CRNA   Modules edited: Notes Section   Notes Section:  File: [id number]

## 2015-04-21 NOTE — Progress Notes (Signed)
OT Cancellation Note  Patient Details Name: Heather Mcpherson MRN: [id number] DOB: 1955-09-02   Cancelled Treatment:    Reason Eval/Treat Not Completed: Patient declined dressing activity. Patient has done this before and is doing well.   Sharon Mt 04/21/2015, 10:13 AM

## 2015-04-21 NOTE — Anesthesia Post-op Follow-up Note (Signed)
  Anesthesia Pain Follow-up Note  Patient: Heather Mcpherson  Day #: 2  Date of Follow-up: 04/21/2015 Time: 9:04 AM  Last Vitals:  Filed Vitals:   04/21/15 0749  BP: 135/92  Pulse: 94  Temp: 36.5 C  Resp: 16    Level of Consciousness: alert  Pain: mild   Side Effects:None  Catheter Site Exam:clean, dry, no drainage  Plan: D/C from anesthesia care  Alison Stalling

## 2015-04-21 NOTE — Progress Notes (Signed)
Physical Therapy Treatment Patient Details Name: Heather Mcpherson MRN: [id number] DOB: 11-Jun-1956 Today's Date: 04/21/2015    History of Present Illness This patient is a 59 year old female who came to [facility] for a L TKR. 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     PT Comments    Pt continues to show good effort and consistent improvement with ambulation, strength, ROM and mobility.  She continues to have expected post-op pain, but is not limited by her pain.  She is pleasant t/o the session and is eager to work with PT.  Follow Up Recommendations  Home health PT;Supervision - Intermittent     Equipment Recommendations  None recommended by PT    Recommendations for Other Services       Precautions / Restrictions Precautions Precautions: Fall Required Braces or Orthoses: Knee Immobilizer - Left Restrictions Other Position/Activity Restrictions: PWB    Mobility  Bed Mobility Overal bed mobility: Modified Independent Bed Mobility: Sit to Supine     Supine to sit: Supervision Sit to supine: Supervision   General bed mobility comments: Pt shows good confidence and safety with mobility  Transfers Overall transfer level: Needs assistance Equipment used: Rolling walker (2 wheeled) Transfers: Sit to/from Stand Sit to Stand: Min guard         General transfer comment: Pt needing minimal cuing for hand placement, but generally able to get to/from standing w/o assist  Ambulation/Gait Ambulation/Gait assistance: Supervision Ambulation Distance (Feet): 200 Feet Assistive device: Rolling walker (2 wheeled)       General Gait Details: Pt again able to ambulate with consistent speed, good safety and appears to be able to handle [place] well.   Stairs            Wheelchair Mobility    Modified Rankin (Stroke Patients Only)       Balance                                    Cognition Arousal/Alertness: Awake/alert Behavior During Therapy: WFL  for tasks assessed/performed Overall Cognitive Status: Within Functional Limits for tasks assessed                      Exercises Total Joint Exercises Ankle Circles/Pumps: AROM;Both;20 reps Quad Sets: AAROM;10 reps Gluteal Sets: Strengthening;10 reps Short Arc Quad: AROM;10 reps Heel Slides: AROM;10 reps Hip ABduction/ADduction: Strengthening;10 reps Straight Leg Raises: AAROM;10 reps    General Comments        Pertinent Vitals/Pain Pain Score: 7     Home Living                      Prior Function            PT Goals (current goals can now be found in the care plan section) Acute Rehab PT Goals Patient Stated Goal: "I can work with PT" Progress towards PT goals: Progressing toward goals    Frequency  BID    PT Plan Current plan remains appropriate    Co-evaluation             End of Session Equipment Utilized During Treatment: Left knee immobilizer Activity Tolerance: Patient tolerated treatment well;Patient limited by fatigue Patient left: with bed alarm set     Time: 1345-1410 PT Time Calculation (min) (ACUTE ONLY): 25 min  Charges:  $Gait Training: 8-22 mins $Therapeutic Exercise: 8-22 mins  G Codes:     Wayne Both, PT, DPT [phone]  Kreg Shropshire 04/21/2015, 7:30 PM

## 2015-04-21 NOTE — Care Management (Addendum)
Plan for discharge to home tomorrow. I have faxed HHPT orders and F2F to [facility] [phone] with discharge date of 04/22/15 per Dr. Sabra Heck.  Received called from [facility] with [facility] and they were not able to establish care for this patient. Evette is calling more home health care agencies and will call this RNCM back with agency obtained. Evette confirm that she did receive home health orders.

## 2015-04-21 NOTE — Progress Notes (Signed)
Subjective: 2 Days Post-Op Procedure(s) (LRB): TOTAL KNEE ARTHROPLASTY (Left)    Patient reports pain as mild. Some cramping in thigh.  More sore today.  Wants to go home tomorrow.    Objective:   VITALS:   Filed Vitals:   04/21/15 0749  BP: 135/92  Pulse: 94  Temp: 97.7 F (36.5 C)  Resp: 16    Neurologically intact Neurovascular intact Sensation intact distally Intact pulses distally Dorsiflexion/Plantar flexion intact Incision: dressing C/D/I  LABS  Recent Labs  04/19/15 1518 04/20/15 0519 04/21/15 0618  HGB 13.3 12.6 11.7*  HCT 40.0 36.2 33.8*  WBC 7.8 9.1 5.7  PLT 199 198 157     Recent Labs  04/19/15 0646 04/19/15 1518 04/20/15 0519  NA 140  --  138  K 4.2  --  3.9  BUN  --   --  13  CREATININE  --  0.77 0.72  GLUCOSE 114*  --  142*    No results for input(s): LABPT, INR in the last 72 hours.   Assessment/Plan: 2 Days Post-Op Procedure(s) (LRB): TOTAL KNEE ARTHROPLASTY (Left)   Advance diet Up with therapy Discharge home with home health tomorrow

## 2015-04-21 NOTE — Discharge Instructions (Signed)
Knee Rehabilitation Guidelines Following Surgery After knee surgery, it is important to follow instructions from your health care provider about range-of-motion (ROM) and muscle strengthening exercises. This will improve your surgery results. If the exercises cause you to have pain or swelling in your knee joint, do them less often until you can do them without pain. Then, slowly increase how often you do your exercises. If you have problems or questions, talk with your health care provider or physical therapist. You should start exercising as soon as your health care provider or physical therapist says it is okay. HOME CARE INSTRUCTIONS Activity  Use your crutches orwalker as told by your health care provider.  Do not lift anything that is heavier than 10 lb (4.5 kg) and do not play contact sports until your health care provider says it is okay.  Return to your normal activities as told by your health care provider. Ask your health care provider what activities are safe for you.  Return to work as told by your health care provider.  Do not drive a car for six weeks or as told by your health care provider. General Instructions  Take over-the-counter and prescription medicines only as told by your health care provider.  Protect your knee during the recovery period to keep it from getting injured again.  You may take sponge baths. Do not take showers or tub baths until your health care provider says it is okay.  Remove throw rugs and tripping hazards from the floor.  Wear elastic stockings for as long as your health care provider instructs you to.  Keep all follow-up visits as told by your health care provider. This is important. RANGE-OF-MOTION AND STRENGTHENING EXERCISES Do your exercises as told by your health care provider or physical therapist. Before You Exercise  Put a towel between your thigh and a heat pack or heating pad.  Leave the heat on your thigh muscle for 20-30  minutes before you exercise. Leg Lifts While your knee is still in a splint or a cast, you can do straight-leg raises. Repeat this exercise 10-20 times, 2-3 times per day. As your knee gets better, do this exercise against resistance. 1. Lie flat on your back. 2. Lift the leg about 6 inches. Keep it raised for 3 seconds. 3. Slowly lower the leg. Quad Sets Repeat this exercise 10-20 times every hour. 1. Lie flat on your back. 2. Tighten your thigh muscle (quad). 3. Keep the muscle tight for 5-10 seconds. Hamstring Sets Repeat this exercise 10-20 times every hour. 1. Push your foot backward against an object that does not move. 2. Keep pushing your foot against it for 5-10 seconds. Weight-Resistance Exercises Weight-resistance exercises are another important part of rehabilitation. These exercises strengthen your muscles by making them work against resistance. Examples include using:  Free weights.  Weight-lifting machines.  Resistance bands. Aerobic Exercises Aerobic exercise keeps joints and muscles moving. It involves large muscle groups. It is also rhythmic in nature and is done for a longer period. Doing these exercises improves circulation and endurance. Your health care provider may have you start by taking a 20-30 minute walk, 2 times per day. Examples of aerobic exercise include:  Swimming.  Walking.  Hiking.  Jogging.  Cross-country skiing.  Bike riding.   This information is not intended to replace advice given to you by your health care provider. Make sure you discuss any questions you have with your health care provider.   Document Released: 06/10/2005 Document Revised:  10/25/2014 Document Reviewed: 06/06/2014 Elsevier Interactive Patient Education [employer].

## 2015-04-21 NOTE — Progress Notes (Signed)
Physical Therapy Treatment Patient Details Name: RAYNE COWDREY MRN: [id number] DOB: 11-01-1955 Today's Date: 04/21/2015    History of Present Illness This patient is a 59 year old female who came to [facility] for a L TKR. 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     PT Comments    Pt is able to ambulate around the nurses' station, shows good ability to initiate quad contractions today and generally shows good ability to participate with PT.  She has expected post-op pain, but is not limited by it.  Pt with over 80 degrees of PROM flexion and good overall mobility.    Follow Up Recommendations  Home health PT;Supervision - Intermittent     Equipment Recommendations  None recommended by PT    Recommendations for Other Services       Precautions / Restrictions Precautions Required Braces or Orthoses: Knee Immobilizer - Left Restrictions Weight Bearing Restrictions: Yes Other Position/Activity Restrictions: PWB    Mobility  Bed Mobility               General bed mobility comments: Pt in recliner on arrival, NT  Transfers Overall transfer level: Needs assistance Equipment used: Rolling walker (2 wheeled) Transfers: Sit to/from Stand Sit to Stand: Min guard         General transfer comment: Pt needing minimal cuing for hand placement, but generally able to get to/from standing w/o assist  Ambulation/Gait Ambulation/Gait assistance: Supervision Ambulation Distance (Feet): 200 Feet Assistive device: Rolling walker (2 wheeled)       General Gait Details: Pt shows good confidence with ambulation.  She is able to maintain consistent gait, shows minimal fatigue, good overall safety and balance.  Appears to be able to maintain [place] well.   Stairs            Wheelchair Mobility    Modified Rankin (Stroke Patients Only)       Balance                                    Cognition Arousal/Alertness: Awake/alert Behavior During Therapy:  WFL for tasks assessed/performed Overall Cognitive Status: Within Functional Limits for tasks assessed                      Exercises Total Joint Exercises Ankle Circles/Pumps: AROM;Both;20 reps Quad Sets: AAROM;10 reps Short Arc Quad: AROM;10 reps Hip ABduction/ADduction: Strengthening;10 reps Straight Leg Raises: AAROM;10 reps Long Arc Quad: AAROM;AROM;10 reps Knee Flexion: PROM;AAROM;5 reps Goniometric ROM: 0-81    General Comments        Pertinent Vitals/Pain Pain Assessment: 0-10 Pain Score: 6     Home Living                      Prior Function            PT Goals (current goals can now be found in the care plan section) Progress towards PT goals: Progressing toward goals    Frequency  BID    PT Plan Current plan remains appropriate    Co-evaluation             End of Session Equipment Utilized During Treatment: Left knee immobilizer Activity Tolerance: Patient tolerated treatment well;Patient limited by fatigue Patient left: in chair;with nursing/sitter in room     Time: 1607-3710 PT Time Calculation (min) (ACUTE ONLY): 26 min  Charges:  $Gait  Training: 8-22 mins $Therapeutic Exercise: 8-22 mins                    G Codes:     Wayne Both, [place], DPT [phone]  Kreg Shropshire 04/21/2015, 11:00 AM

## 2015-04-22 LAB — CBC
HEMATOCRIT: 31.6 % — AB (ref 35.0–47.0)
Hemoglobin: 11 g/dL — ABNORMAL LOW (ref 12.0–16.0)
MCH: 31.2 pg (ref 26.0–34.0)
MCHC: 34.7 g/dL (ref 32.0–36.0)
MCV: 90 fL (ref 80.0–100.0)
PLATELETS: 163 10*3/uL (ref 150–440)
RBC: 3.52 MIL/uL — ABNORMAL LOW (ref 3.80–5.20)
RDW: 12.6 % (ref 11.5–14.5)
WBC: 5.5 10*3/uL (ref 3.6–11.0)

## 2015-04-22 NOTE — Progress Notes (Signed)
Patient being discharged to home with [facility]. Patient has been cleared by PT. IV's removed, discharge Rx & instructions given. Belongings packed. Husband will be taking her home.

## 2015-04-22 NOTE — Progress Notes (Signed)
Physical Therapy Treatment Patient Details Name: Heather Mcpherson MRN: [id number] DOB: 1955/08/09 Today's Date: 04/22/2015    History of Present Illness This patient is a 59 year old female who came to [facility] for a L TKR. 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     PT Comments    Pt continues to show good effort and overall safety with exercises and mobility.  She is still this AM and only achieves mid 60s with PROM knee flexion.  Her ambulation and mobility are progressing nicely and is safe with these.  Pt eager to go home.   Follow Up Recommendations  Home health PT;Supervision - Intermittent     Equipment Recommendations  None recommended by PT    Recommendations for Other Services       Precautions / Restrictions Precautions Precautions: Fall Restrictions Other Position/Activity Restrictions: PWB    Mobility  Bed Mobility Overal bed mobility: Modified Independent Bed Mobility: Sit to Supine     Supine to sit: Supervision Sit to supine: Supervision   General bed mobility comments: Pt able to get to EOB w/o direct assist and shows good confidence and safety  Transfers Overall transfer level: Modified independent Equipment used: Rolling walker (2 wheeled) Transfers: Sit to/from Stand Sit to Stand: Min guard         General transfer comment: Pt able to rise to standing with less cuing today and ultimately is safe and confident with transition  Ambulation/Gait Ambulation/Gait assistance: Supervision Ambulation Distance (Feet): 250 Feet Assistive device: Rolling walker (2 wheeled)       General Gait Details: Pt walking with good confidence and though she has some expected fatigue she is consistent with cadence and has no safety issues   Stairs            Wheelchair Mobility    Modified Rankin (Stroke Patients Only)       Balance                                    Cognition Arousal/Alertness: Awake/alert Behavior During  Therapy: WFL for tasks assessed/performed Overall Cognitive Status: Within Functional Limits for tasks assessed                      Exercises Total Joint Exercises Ankle Circles/Pumps: AROM;Both;20 reps Quad Sets: AAROM;10 reps Gluteal Sets: Strengthening;10 reps Short Arc Quad: AROM;10 reps Heel Slides: AROM;10 reps Hip ABduction/ADduction: Strengthening;10 reps Straight Leg Raises: AAROM;10 reps Goniometric ROM: 0-67    General Comments        Pertinent Vitals/Pain Pain Score: 6     Home Living                      Prior Function            PT Goals (current goals can now be found in the care plan section) Progress towards PT goals: Progressing toward goals    Frequency  BID    PT Plan Current plan remains appropriate    Co-evaluation             End of Session Equipment Utilized During Treatment: Gait belt Activity Tolerance: Patient tolerated treatment well;Patient limited by fatigue Patient left: with chair alarm set     Time: 4098-1191 PT Time Calculation (min) (ACUTE ONLY): 25 min  Charges:  $Gait Training: 8-22 mins $Therapeutic Exercise: 8-22 mins  G Codes:     Wayne Both, PT, DPT [phone]  Kreg Shropshire 04/22/2015, 11:49 AM

## 2015-04-22 NOTE — Progress Notes (Signed)
Pt. Alert and oriented. VSS. Pain controlled with PO pain meds. MOM given at bedtime. For d/c to home today. Will continue to monitor.

## 2015-04-22 NOTE — Care Management Note (Signed)
Case Management Note  Patient Details  Name: Heather Mcpherson MRN: [id number] Date of Birth: October 11, 1955  Subjective/Objective:      [facility] is contracted by [employer] to arrange home health and DME for all [employer] patients. Discussed home health and DME needs this morning with 'Collie Siad" at [facility], ph: [phone]. They will ship a front wheel rolling walker to [name] and will contact [name] at home either today or tomorrow to arrange home health PT. Discussed these arrangements with [name] so that she would know what to expect after discharge home.               Action/Plan:   Expected Discharge Date:                  Expected Discharge Plan:     In-House Referral:     Discharge planning Services  CM Consult  Post Acute Care Choice:  Home Health Choice offered to:  Patient  DME Arranged:    DME Agency:     HH Arranged:  PT [place]:  [place]  Status of Service:  In process, will continue to follow  Medicare Important Message Given:    Date Medicare IM Given:    Medicare IM give by:    Date Additional Medicare IM Given:    Additional Medicare Important Message give by:     If discussed at [place] of Stay Meetings, dates discussed:    Additional Comments:  Eliya Bubar A, RN 04/22/2015, 8:42 AM

## 2015-04-22 NOTE — Care Management Note (Signed)
Case Management Note  Patient Details  Name: KENDALYNN WIDEMAN MRN: [id number] Date of Birth: 16-Nov-1955  Subjective/Objective:             Left message with [facility] ph: [phone] requesting a call back to discuss home health PT arrangements. Awaiting a call back.   Action/Plan:   Expected Discharge Date:                  Expected Discharge Plan:     In-House Referral:     Discharge planning Services  CM Consult  Post Acute Care Choice:  Home Health Choice offered to:  Patient  DME Arranged:    DME Agency:     HH Arranged:  PT [place]:  [place]  Status of Service:  In process, will continue to follow  Medicare Important Message Given:    Date Medicare IM Given:    Medicare IM give by:    Date Additional Medicare IM Given:    Additional Medicare Important Message give by:     If discussed at [place] of Stay Meetings, dates discussed:    Additional Comments:  Linard Daft A, RN 04/22/2015, 8:27 AM

## 2015-04-24 NOTE — Care Management (Addendum)
Post discharge on 04/22/15: Received call from [place] stating that home health was not arranged because they couldn't find a home health agency in-network with [name]. Spoke with Janett Billow at [facility] and she transferred me to her supervisor. [facility] does not assist with outpatient PT arrangements- it has to go through [facility]. I have called Christella Scheuermann [phone] to arrange outpatient PT for this patient: Heather Mcpherson and he again transferred me to [phone]. They could not assist either. I have talked to patient and she wants to follow up with OP PT at DR. Miller's office sooner than later. I have notified DR. Miller's office of this need and patient has left message at Dr. Ammie Ferrier office. She has an office visit on Tuesday at 11AM follow up appointment at DR. [place] office. Patient states she does not need any DME; she already has a walker.

## 2015-04-24 NOTE — Discharge Summary (Signed)
Physician Discharge Summary  Patient ID: Heather Mcpherson MRN: [id number] DOB/AGE: Apr 01, 1956 59 y.o.  Admit date: 04/19/2015 Discharge date: 04/24/2015  Admission Diagnoses: Left knee arthritis  Discharge Diagnoses: same Active Problems:   Total knee replacement status   Discharged Condition: good  Hospital Course: Patient underwent left total knee replacement on 40/98/11 without complication.  She did well in the hospital and hgb remained stable.  She progressed well with PT and was ready for discharge on the third post op day.  She will get home health PT and RTC in 10 days.  EC ASA for anticoagulation.   Consults: None  Significant Diagnostic Studies: labs:    Treatments: Left total knee  Discharge Exam: Blood pressure 141/85, pulse 97, temperature 98.2 F (36.8 C), temperature source Oral, resp. rate 16, height 5\' 8"  (1.727 m), weight 117.028 kg (258 lb), SpO2 95 %. Incision/Wound: healing well.  CSM good distally.  Skin intact.    Disposition: 06-Home-Health Care Svc  Discharge Instructions    Call MD for:  persistant nausea and vomiting    Complete by:  As directed      Call MD for:  redness, tenderness, or signs of infection (pain, swelling, redness, odor or green/yellow discharge around incision site)    Complete by:  As directed      Call MD for:  severe uncontrolled pain    Complete by:  As directed      Call MD for:  temperature >100.4    Complete by:  As directed      Diet - low sodium heart healthy    Complete by:  As directed      Face-to-face encounter (required for Medicare/Medicaid patients)    Complete by:  As directed   I Jaria Conway E certify that this patient is under my care and that I, or a nurse practitioner or physician's assistant working with me, had a face-to-face encounter that meets the physician face-to-face encounter requirements with this patient on 04/21/2015. The encounter with the patient was in whole, or in part for the following  medical condition(s) which is the primary reason for home health care (List medical condition): Total knee surgery  The encounter with the patient was in whole, or in part, for the following medical condition, which is the primary reason for home health care:  Total knee surgery  I certify that, based on my findings, the following services are medically necessary home health services:  Physical therapy  Reason for Medically Necessary Home Health Services:  Therapy- Personnel officer, Public librarian  My clinical findings support the need for the above services:  Pain interferes with ambulation/mobility  Further, I certify that my clinical findings support that this patient is homebound due to:  Unable to leave home safely without assistance     Home Health    Complete by:  As directed   To provide the following care/treatments:  PT     Increase activity slowly    Complete by:  As directed      Leave dressing on - Keep it clean, dry, and intact until clinic visit    Complete by:  As directed             Medication List    TAKE these medications        aspirin EC 325 MG tablet  Take 1 tablet (325 mg total) by mouth 2 (two) times daily. Take for 4 weeks     atorvastatin 10  MG tablet  Commonly known as:  LIPITOR  Take 10 mg by mouth at bedtime.     celecoxib 200 MG capsule  Commonly known as:  CELEBREX  Take 200 mg by mouth daily.     cetirizine 10 MG tablet  Commonly known as:  ZYRTEC  Take 10 mg by mouth daily.     cholecalciferol 1000 UNITS tablet  Commonly known as:  VITAMIN D  Take 1,000 Units by mouth 2 (two) times daily.     gabapentin 400 MG capsule  Commonly known as:  NEURONTIN  Take 1 capsule (400 mg total) by mouth 3 (three) times daily.     HYDROcodone-acetaminophen 7.5-325 MG tablet  Commonly known as:  NORCO  Take 1 tablet by mouth every 6 (six) hours.     Magnesium 200 MG Tabs  Take 1 tablet by mouth daily.     methocarbamol 500 MG  tablet  Commonly known as:  ROBAXIN  Take 1 tablet (500 mg total) by mouth every 6 (six) hours as needed for muscle spasms.     Omeprazole-Sodium Bicarbonate 20-1100 MG Caps capsule  Commonly known as:  ZEGERID  Take 1 capsule by mouth daily before breakfast.     SYNTHROID 112 MCG tablet  Generic drug:  levothyroxine  Take 112 mcg by mouth daily before breakfast.     TYLENOL ARTHRITIS PAIN 650 MG CR tablet  Generic drug:  acetaminophen  Take 1,300 mg by mouth 2 (two) times daily.     venlafaxine XR 150 MG 24 hr capsule  Commonly known as:  EFFEXOR-XR  Take 150 mg by mouth daily.           Follow-up Information    Follow up with Niani Mourer E, MD. Schedule an appointment as soon as possible for a visit in 10 days.   Specialty:  Specialist   Why:  For wound re-check   Contact information:   [place] [address] [phone]       Signed: Park Breed 04/24/2015, 12:59 PM

## 2015-06-02 ENCOUNTER — Other Ambulatory Visit: Payer: Self-pay | Admitting: *Deleted

## 2015-06-02 DIAGNOSIS — C50919 Malignant neoplasm of unspecified site of unspecified female breast: Secondary | ICD-10-CM

## 2015-06-05 ENCOUNTER — Inpatient Hospital Stay: Payer: 59 | Admitting: Oncology

## 2015-06-05 ENCOUNTER — Inpatient Hospital Stay: Payer: 59

## 2015-06-22 ENCOUNTER — Inpatient Hospital Stay: Payer: 59 | Attending: Oncology

## 2015-06-22 ENCOUNTER — Inpatient Hospital Stay (HOSPITAL_BASED_OUTPATIENT_CLINIC_OR_DEPARTMENT_OTHER): Payer: 59 | Admitting: Oncology

## 2015-06-22 VITALS — BP 122/82 | HR 86 | Temp 97.2°F | Resp 18 | Wt 262.8 lb

## 2015-06-22 DIAGNOSIS — K219 Gastro-esophageal reflux disease without esophagitis: Secondary | ICD-10-CM

## 2015-06-22 DIAGNOSIS — F429 Obsessive-compulsive disorder, unspecified: Secondary | ICD-10-CM

## 2015-06-22 DIAGNOSIS — Z79899 Other long term (current) drug therapy: Secondary | ICD-10-CM | POA: Diagnosis not present

## 2015-06-22 DIAGNOSIS — F419 Anxiety disorder, unspecified: Secondary | ICD-10-CM | POA: Insufficient documentation

## 2015-06-22 DIAGNOSIS — E039 Hypothyroidism, unspecified: Secondary | ICD-10-CM

## 2015-06-22 DIAGNOSIS — Z7722 Contact with and (suspected) exposure to environmental tobacco smoke (acute) (chronic): Secondary | ICD-10-CM | POA: Insufficient documentation

## 2015-06-22 DIAGNOSIS — N189 Chronic kidney disease, unspecified: Secondary | ICD-10-CM | POA: Insufficient documentation

## 2015-06-22 DIAGNOSIS — Z853 Personal history of malignant neoplasm of breast: Secondary | ICD-10-CM | POA: Diagnosis not present

## 2015-06-22 DIAGNOSIS — R600 Localized edema: Secondary | ICD-10-CM | POA: Insufficient documentation

## 2015-06-22 DIAGNOSIS — E7800 Pure hypercholesterolemia, unspecified: Secondary | ICD-10-CM | POA: Insufficient documentation

## 2015-06-22 DIAGNOSIS — C50912 Malignant neoplasm of unspecified site of left female breast: Secondary | ICD-10-CM

## 2015-06-22 DIAGNOSIS — C50919 Malignant neoplasm of unspecified site of unspecified female breast: Secondary | ICD-10-CM

## 2015-06-22 DIAGNOSIS — Z7982 Long term (current) use of aspirin: Secondary | ICD-10-CM

## 2015-06-22 NOTE — Progress Notes (Signed)
Patient had left TKR 9 weeks ago.

## 2015-06-23 LAB — CANCER ANTIGEN 27.29: CA 27.29: 19 U/mL (ref 0.0–38.6)

## 2015-06-26 NOTE — Progress Notes (Signed)
[place]  [phone]) [phone] [phone]  ID: Heather Mcpherson OB: 1955/12/02  MR#: [id number]  UDJ#:[id number]  Patient Care Team: Glendon Axe, MD as PCP - General (Internal Medicine)  CHIEF COMPLAINT:  Chief Complaint  Patient presents with  . Breast Cancer    INTERVAL HISTORY: Patient returns to clinic today for routine yearly evaluation.  She currently feels well and is asymptomatic. She had a second knee replacement several months ago with significant improvement of her pain. She has no neurologic complaints.  She denies any fevers, chills, or weight loss. She denies any chest pain or shortness of breath.  She denies any bony pain. She denies any nausea, vomiting, constipation, or diarrhea. Patient offers no specific complaints today.  REVIEW OF SYSTEMS:   Review of Systems  Constitutional: Negative for fever and malaise/fatigue.  Respiratory: Negative.   Cardiovascular: Negative.   Gastrointestinal: Negative.   Musculoskeletal: Positive for joint pain.  Neurological: Negative.  Negative for weakness.    As per HPI. Otherwise, a complete review of systems is negatve.  PAST MEDICAL HISTORY: Past Medical History  Diagnosis Date  . Thyroid disease   . GERD (gastroesophageal reflux disease)   . Hypothyroidism   . Cancer (HCC)     breast  . Hypercholesteremia   . Depression   . Anxiety   . Chronic kidney disease     UTI  . Ankle edema   . Breast cancer [facility]) 2009    right breast, chemo and radiation    PAST SURGICAL HISTORY: Past Surgical History  Procedure Laterality Date  . Abdominal hysterectomy    . Eye surgery      cataracts  . Surgery on heel, foot elbow    . Evacuation of hematoma right breast    . Colonoscopy with propofol N/A 01/19/2015    Procedure: COLONOSCOPY WITH PROPOFOL;  Surgeon: Josefine Class, MD;  Location: [facility] ENDOSCOPY;  Service: Endoscopy;  Laterality: N/A;  . Joint replacement Right July 14, 2013    Right Knee Replacement  . Retinal detachment surgery Left September 2007  . Breast surgery Right Oct 23, 2007    Lumpectomy  . Breast excisional biopsy Right 2009    positive  . Breast biopsy Right 2011    negative, stereo  . Breast biopsy Right 2014    negative, stereo  . Total knee arthroplasty Left 04/19/2015    Procedure: TOTAL KNEE ARTHROPLASTY;  Surgeon: Earnestine Leys, MD;  Location: [facility] ORS;  Service: Orthopedics;  Laterality: Left;    FAMILY HISTORY: Reviewed and unchanged. No reported history of malignancy or chronic disease.     ADVANCED DIRECTIVES:    HEALTH MAINTENANCE: Social History  Substance Use Topics  . Smoking status: Passive Smoke Exposure - Never Smoker  . Smokeless tobacco: Never Used  . Alcohol Use: No     Colonoscopy:  PAP:  Bone density:  Lipid panel:  No Known Allergies  Current Outpatient Prescriptions  Medication Sig Dispense Refill  . acetaminophen (TYLENOL ARTHRITIS PAIN) 650 MG CR tablet Take 1,300 mg by mouth 2 (two) times daily.    Marland Kitchen atorvastatin (LIPITOR) 10 MG tablet Take 10 mg by mouth at bedtime.     . celecoxib (CELEBREX) 200 MG capsule Take 200 mg by mouth daily.     . cetirizine (ZYRTEC) 10 MG tablet Take 10 mg by mouth daily.    . cholecalciferol (VITAMIN D) 1000 UNITS tablet Take 1,000 Units by mouth 2 (two) times daily.    Marland Kitchen  HYDROcodone-acetaminophen (NORCO) 7.5-325 MG tablet Take 1 tablet by mouth every 6 (six) hours. 30 tablet 0  . Magnesium 200 MG TABS Take 1 tablet by mouth daily.     . methocarbamol (ROBAXIN) 500 MG tablet Take 1 tablet (500 mg total) by mouth every 6 (six) hours as needed for muscle spasms. 60 tablet 3  . Omeprazole-Sodium Bicarbonate (ZEGERID) 20-1100 MG CAPS capsule Take 1 capsule by mouth daily before breakfast.     . SYNTHROID 112 MCG tablet Take 112 mcg by mouth daily before breakfast.     . venlafaxine XR (EFFEXOR-XR) 150 MG 24 hr capsule Take 150 mg by mouth daily.     Marland Kitchen aspirin EC 325 MG  tablet Take 1 tablet (325 mg total) by mouth 2 (two) times daily. Take for 4 weeks (Patient not taking: Reported on 06/22/2015) 60 tablet 0  . gabapentin (NEURONTIN) 400 MG capsule Take 1 capsule (400 mg total) by mouth 3 (three) times daily. (Patient not taking: Reported on 06/22/2015) 60 capsule 3   No current facility-administered medications for this visit.    OBJECTIVE: Filed Vitals:   06/22/15 1553  BP: 122/82  Pulse: 86  Temp: 97.2 F (36.2 C)  Resp: 18     Body mass index is 39.97 kg/(m^2).    ECOG FS:0 - Asymptomatic  General: Well-developed, well-nourished, no acute distress. Eyes: Pink conjunctiva, anicteric sclera. Breasts: Bilateral breast and axilla without lumps or masses. Lungs: Clear to auscultation bilaterally. Heart: Regular rate and rhythm. No rubs, murmurs, or gallops. Abdomen: Soft, nontender, nondistended. No organomegaly noted, normoactive bowel sounds. Musculoskeletal: No edema, cyanosis, or clubbing. Neuro: Alert, answering all questions appropriately. Cranial nerves grossly intact. Skin: No rashes or petechiae noted. Psych: Normal affect.   LAB RESULTS:  Lab Results  Component Value Date   NA 138 04/20/2015   K 3.9 04/20/2015   CL 102 04/20/2015   CO2 30 04/20/2015   GLUCOSE 142* 04/20/2015   BUN 13 04/20/2015   CREATININE 0.72 04/20/2015   CALCIUM 8.9 04/20/2015   PROT 7.2 12/01/2011   ALBUMIN 3.9 12/01/2011   AST 29 12/01/2011   ALT 27 12/01/2011   ALKPHOS 80 12/01/2011   BILITOT 0.4 12/01/2011   GFRNONAA >60 04/20/2015   GFRAA >60 04/20/2015    Lab Results  Component Value Date   WBC 5.5 04/22/2015   NEUTROABS 6.4 07/15/2013   HGB 11.0* 04/22/2015   HCT 31.6* 04/22/2015   MCV 90.0 04/22/2015   PLT 163 04/22/2015     STUDIES: No results found.  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  PLAN:    1.  Breast cancer: No evidence of disease. Patient completed her year-long of  Herceptin on Nov 10, 2008. She has also completed 5 years of letrozole.  Patient's recent mammogram on April 13, 2015 was reported as BI-RADS 2. Repeat October 2017. Return to clinic in 1 year for routine evaluation.   Patient expressed understanding and was in agreement with this plan. She also understands that She can call clinic at any time with any questions, concerns, or complaints.    Lloyd Huger, MD   06/26/2015 12:14 PM

## 2016-04-15 ENCOUNTER — Ambulatory Visit
Admission: RE | Admit: 2016-04-15 | Discharge: 2016-04-15 | Disposition: A | Discharge status: Home / Self Care | Payer: 59 | Source: Ambulatory Visit | Attending: Oncology | Admitting: Oncology

## 2016-04-15 ENCOUNTER — Other Ambulatory Visit: Payer: Self-pay | Admitting: Oncology

## 2016-04-15 DIAGNOSIS — C50912 Malignant neoplasm of unspecified site of left female breast: Secondary | ICD-10-CM

## 2016-06-19 DIAGNOSIS — C50911 Malignant neoplasm of unspecified site of right female breast: Secondary | ICD-10-CM | POA: Insufficient documentation

## 2016-06-19 NOTE — Progress Notes (Signed)
[place]  [phone]) [phone] [phone]) [phone]  ID: Heather Mcpherson OB: 1955/12/16  MR#: [id number]  PXT#:[id number]  Patient Care Team: Glendon Axe, MD as PCP - General (Internal Medicine)  CHIEF COMPLAINT: Pathologic stage Ia ER/PR/HER-2 + invasive carcinoma of the right breast, unspecified site.  INTERVAL HISTORY: Patient returns to clinic today for routine yearly evaluation.  She currently feels well and is asymptomatic. She has both knees replaced and no longer complains of knee pain. She has no neurologic complaints.  She denies any fevers, chills, or weight loss. She denies any chest pain or shortness of breath.  She denies any bony pain. She denies any nausea, vomiting, constipation, or diarrhea. Patient offers no specific complaints today.  REVIEW OF SYSTEMS:   Review of Systems  Constitutional: Negative for fever, malaise/fatigue and weight loss.  Respiratory: Negative.  Negative for cough and shortness of breath.   Cardiovascular: Negative.  Negative for chest pain and leg swelling.  Gastrointestinal: Negative.  Negative for abdominal pain.  Genitourinary: Negative.   Musculoskeletal: Negative.  Negative for joint pain.  Neurological: Negative.  Negative for weakness.  Psychiatric/Behavioral: Negative.  The patient is not nervous/anxious.     As per HPI. Otherwise, a complete review of systems is negative.  PAST MEDICAL HISTORY: Past Medical History:  Diagnosis Date  . Ankle edema   . Anxiety   . Breast cancer [facility]) 2009   right breast, chemo and radiation  . Cancer ([place])    breast  . Chronic kidney disease    UTI  . Depression   . GERD (gastroesophageal reflux disease)   . Hypercholesteremia   . Hypothyroidism   . Thyroid disease     PAST SURGICAL HISTORY: Past Surgical History:  Procedure Laterality Date  . ABDOMINAL HYSTERECTOMY    . BREAST BIOPSY Right 2011   negative, stereo  . BREAST BIOPSY Right 2014   negative,  stereo  . BREAST EXCISIONAL BIOPSY Right 2009   positive  . BREAST SURGERY Right Oct 23, 2007   Lumpectomy  . COLONOSCOPY WITH PROPOFOL N/A 01/19/2015   Procedure: COLONOSCOPY WITH PROPOFOL;  Surgeon: Josefine Class, MD;  Location: [facility] ENDOSCOPY;  Service: Endoscopy;  Laterality: N/A;  . evacuation of hematoma right breast    . EYE SURGERY     cataracts  . JOINT REPLACEMENT Right July 14, 2013   Right Knee Replacement  . RETINAL DETACHMENT SURGERY Left September 2007  . surgery on heel, foot elbow    . TOTAL KNEE ARTHROPLASTY Left 04/19/2015   Procedure: TOTAL KNEE ARTHROPLASTY;  Surgeon: Earnestine Leys, MD;  Location: [facility] ORS;  Service: Orthopedics;  Laterality: Left;    FAMILY HISTORY: Reviewed and unchanged. No reported history of malignancy or chronic disease.     ADVANCED DIRECTIVES:    HEALTH MAINTENANCE: Social History  Substance Use Topics  . Smoking status: Passive Smoke Exposure - Never Smoker  . Smokeless tobacco: Never Used  . Alcohol use No     Colonoscopy:  PAP:  Bone density:  Lipid panel:  No Known Allergies  Current Outpatient Prescriptions  Medication Sig Dispense Refill  . acetaminophen (TYLENOL ARTHRITIS PAIN) 650 MG CR tablet Take 1,300 mg by mouth 2 (two) times daily.    Marland Kitchen atorvastatin (LIPITOR) 10 MG tablet Take 10 mg by mouth at bedtime.     . celecoxib (CELEBREX) 200 MG capsule Take 200 mg by mouth daily.     . cetirizine (ZYRTEC) 10 MG tablet Take 10 mg  by mouth daily.    . cholecalciferol (VITAMIN D) 1000 UNITS tablet Take 1,000 Units by mouth 2 (two) times daily.    Marland Kitchen HYDROcodone-acetaminophen (NORCO) 7.5-325 MG tablet Take 1 tablet by mouth every 6 (six) hours. 30 tablet 0  . Magnesium 200 MG TABS Take 1 tablet by mouth daily.     . methocarbamol (ROBAXIN) 500 MG tablet Take 1 tablet (500 mg total) by mouth every 6 (six) hours as needed for muscle spasms. 60 tablet 3  . Omeprazole-Sodium Bicarbonate (ZEGERID) 20-1100 MG CAPS capsule  Take 1 capsule by mouth daily before breakfast.     . SYNTHROID 112 MCG tablet Take 112 mcg by mouth daily before breakfast.     . venlafaxine XR (EFFEXOR-XR) 150 MG 24 hr capsule Take 150 mg by mouth daily.      No current facility-administered medications for this visit.     OBJECTIVE: Vitals:   06/20/16 1046  BP: 119/81  Pulse: (!) 108  Resp: 18  Temp: 97.1 F (36.2 C)     Body mass index is 38.23 kg/m.    ECOG FS:0 - Asymptomatic  General: Well-developed, well-nourished, no acute distress. Eyes: Pink conjunctiva, anicteric sclera. Breasts: Bilateral breast and axilla without lumps or masses. Complete breast exam on June 20, 2016. Lungs: Clear to auscultation bilaterally. Heart: Regular rate and rhythm. No rubs, murmurs, or gallops. Abdomen: Soft, nontender, nondistended. No organomegaly noted, normoactive bowel sounds. Musculoskeletal: No edema, cyanosis, or clubbing. Neuro: Alert, answering all questions appropriately. Cranial nerves grossly intact. Skin: No rashes or petechiae noted. Psych: Normal affect.   LAB RESULTS:  Lab Results  Component Value Date   NA 138 04/20/2015   K 3.9 04/20/2015   CL 102 04/20/2015   CO2 30 04/20/2015   GLUCOSE 142 (H) 04/20/2015   BUN 13 04/20/2015   CREATININE 0.72 04/20/2015   CALCIUM 8.9 04/20/2015   PROT 7.2 12/01/2011   ALBUMIN 3.9 12/01/2011   AST 29 12/01/2011   ALT 27 12/01/2011   ALKPHOS 80 12/01/2011   BILITOT 0.4 12/01/2011   GFRNONAA >60 04/20/2015   GFRAA >60 04/20/2015    Lab Results  Component Value Date   WBC 5.5 04/22/2015   NEUTROABS 6.4 07/15/2013   HGB 11.0 (L) 04/22/2015   HCT 31.6 (L) 04/22/2015   MCV 90.0 04/22/2015   PLT 163 04/22/2015     STUDIES: No results found.  ASSESSMENT: Pathologic stage Ia ER/PR/HER-2 + invasive carcinoma of the right breast, unspecified site.  PLAN:    1. Pathologic stage Ia ER/PR/HER-2 + invasive carcinoma of the right breast, unspecified site: No  evidence of disease. Patient completed her year-long of Herceptin on Nov 10, 2008. She has also completed 5 years of letrozole.  Patient's recent mammogram on April 15, 2016 was reported as BI-RADS 2. Breast exam performed today. No lumps or masses identified. Repeat October 2018. Return to clinic in 1 year for routine evaluation.   Patient expressed understanding and was in agreement with this plan. She also understands that She can call clinic at any time with any questions, concerns, or complaints.   Faythe Casa, NP 06/20/2016 1:40 PM  Patient was seen and evaluated independently and I agree with the assessment and plan as dictated above.  Lloyd Huger, MD 06/21/16 3:54 PM

## 2016-06-20 ENCOUNTER — Inpatient Hospital Stay: Payer: 59

## 2016-06-20 ENCOUNTER — Inpatient Hospital Stay: Payer: 59 | Attending: Oncology | Admitting: Oncology

## 2016-06-20 VITALS — BP 119/81 | HR 108 | Temp 97.1°F | Resp 18 | Wt 251.4 lb

## 2016-06-20 DIAGNOSIS — E7800 Pure hypercholesterolemia, unspecified: Secondary | ICD-10-CM | POA: Diagnosis not present

## 2016-06-20 DIAGNOSIS — Z7722 Contact with and (suspected) exposure to environmental tobacco smoke (acute) (chronic): Secondary | ICD-10-CM | POA: Insufficient documentation

## 2016-06-20 DIAGNOSIS — Z9221 Personal history of antineoplastic chemotherapy: Secondary | ICD-10-CM | POA: Diagnosis not present

## 2016-06-20 DIAGNOSIS — C50911 Malignant neoplasm of unspecified site of right female breast: Secondary | ICD-10-CM

## 2016-06-20 DIAGNOSIS — F329 Major depressive disorder, single episode, unspecified: Secondary | ICD-10-CM | POA: Insufficient documentation

## 2016-06-20 DIAGNOSIS — E039 Hypothyroidism, unspecified: Secondary | ICD-10-CM | POA: Diagnosis not present

## 2016-06-20 DIAGNOSIS — Z87440 Personal history of urinary (tract) infections: Secondary | ICD-10-CM | POA: Diagnosis not present

## 2016-06-20 DIAGNOSIS — Z170 Estrogen receptor positive status [ER+]: Secondary | ICD-10-CM

## 2016-06-20 DIAGNOSIS — Z923 Personal history of irradiation: Secondary | ICD-10-CM | POA: Diagnosis not present

## 2016-06-20 DIAGNOSIS — F419 Anxiety disorder, unspecified: Secondary | ICD-10-CM | POA: Diagnosis not present

## 2016-06-20 DIAGNOSIS — R609 Edema, unspecified: Secondary | ICD-10-CM | POA: Diagnosis not present

## 2016-06-20 DIAGNOSIS — K219 Gastro-esophageal reflux disease without esophagitis: Secondary | ICD-10-CM | POA: Diagnosis not present

## 2016-06-20 DIAGNOSIS — Z79899 Other long term (current) drug therapy: Secondary | ICD-10-CM

## 2016-06-20 DIAGNOSIS — Z853 Personal history of malignant neoplasm of breast: Secondary | ICD-10-CM | POA: Insufficient documentation

## 2016-06-20 DIAGNOSIS — C50912 Malignant neoplasm of unspecified site of left female breast: Secondary | ICD-10-CM

## 2016-06-20 NOTE — Progress Notes (Signed)
Offers no complaints. Feeling well. 

## 2016-06-21 LAB — CANCER ANTIGEN 27.29: CA 27.29: 17.3 U/mL (ref 0.0–38.6)

## 2016-12-24 IMAGING — MG MM DIAG BREAST TOMO BILATERAL
8 of 15 series · 8 of 31 positions shown · non-contrast
Comparison: Previous exams including most recent diagnostic
mammogram dated 10/03/2014.

ACR Breast Density Category a: The breast tissue is almost entirely
fatty.

CLINICAL DATA: History of right breast cancer in 5333 status post
breast conservation therapy.

Six-month follow-up for probably benign low-density nodule within
the left breast and probably benign right breast calcifications.
EXAM:
DIGITAL DIAGNOSTIC BILATERAL MAMMOGRAM WITH 3D TOMOSYNTHESIS AND CAD

[R CC (1 of 2)]
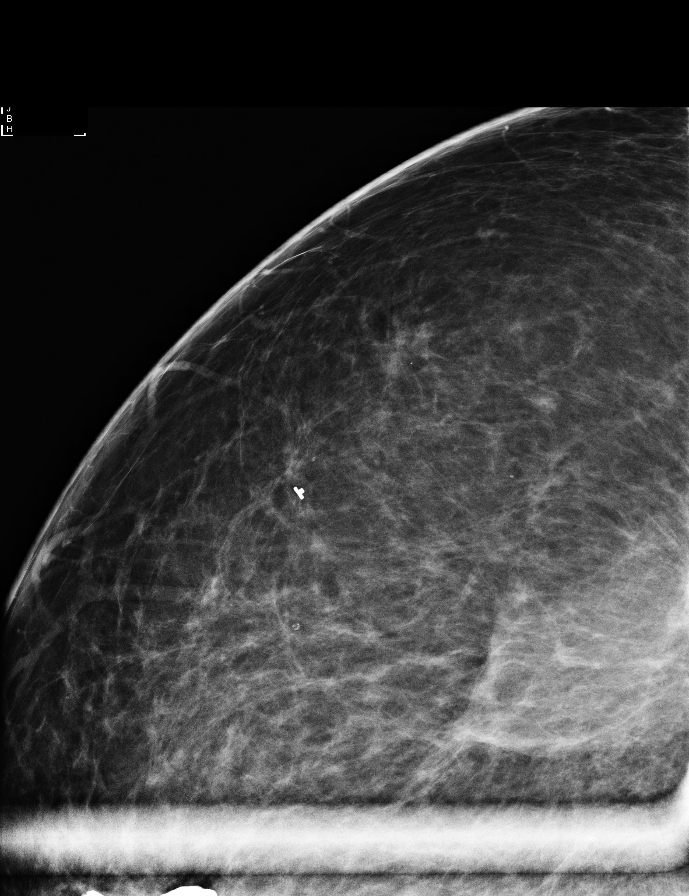

[L MLO]
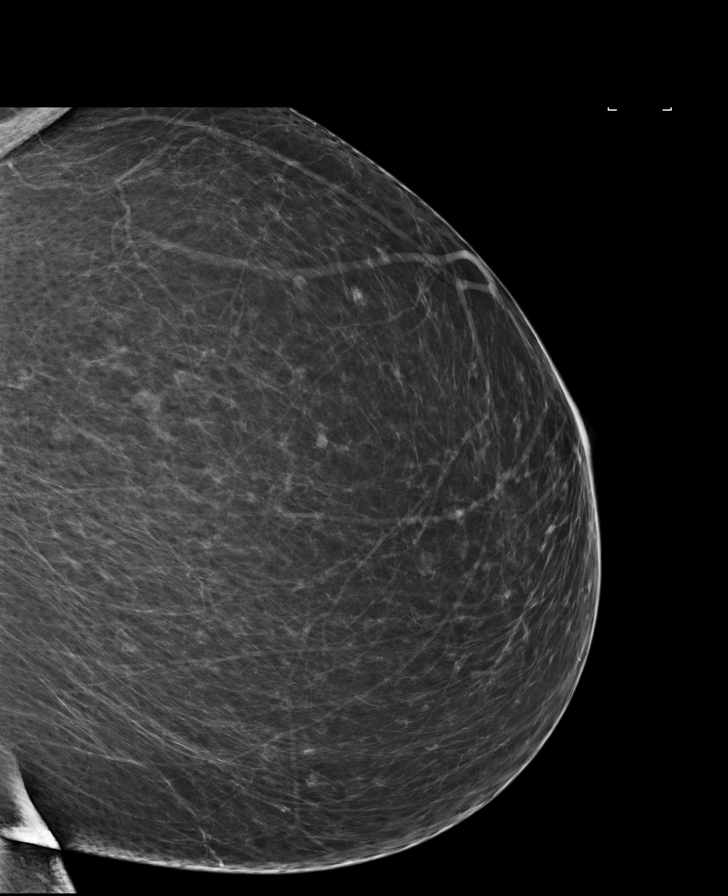

[R MLO (1 of 2)]
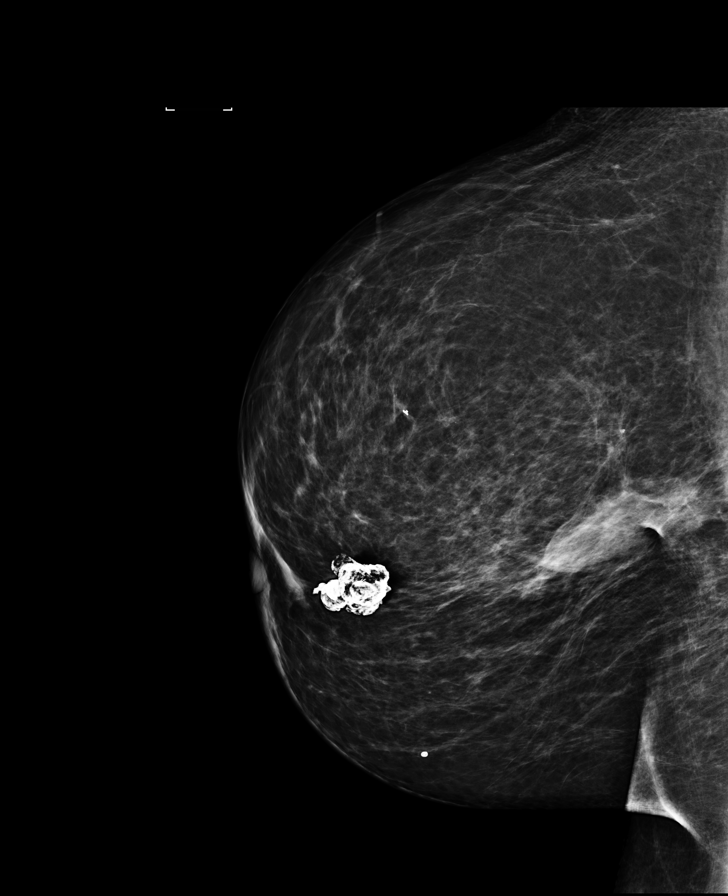

[R MLO synth-2D]
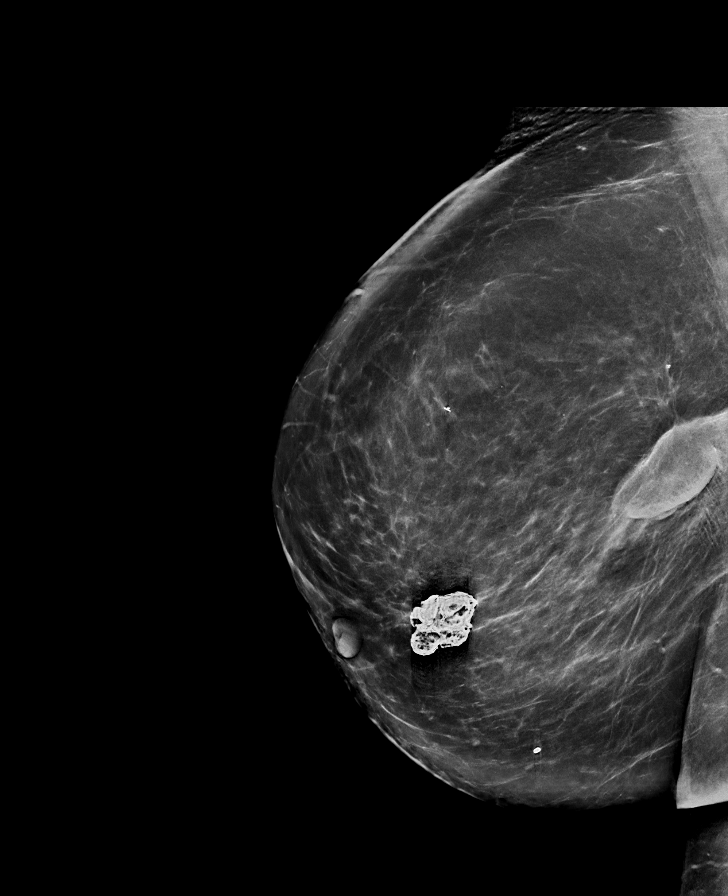

[L CC]
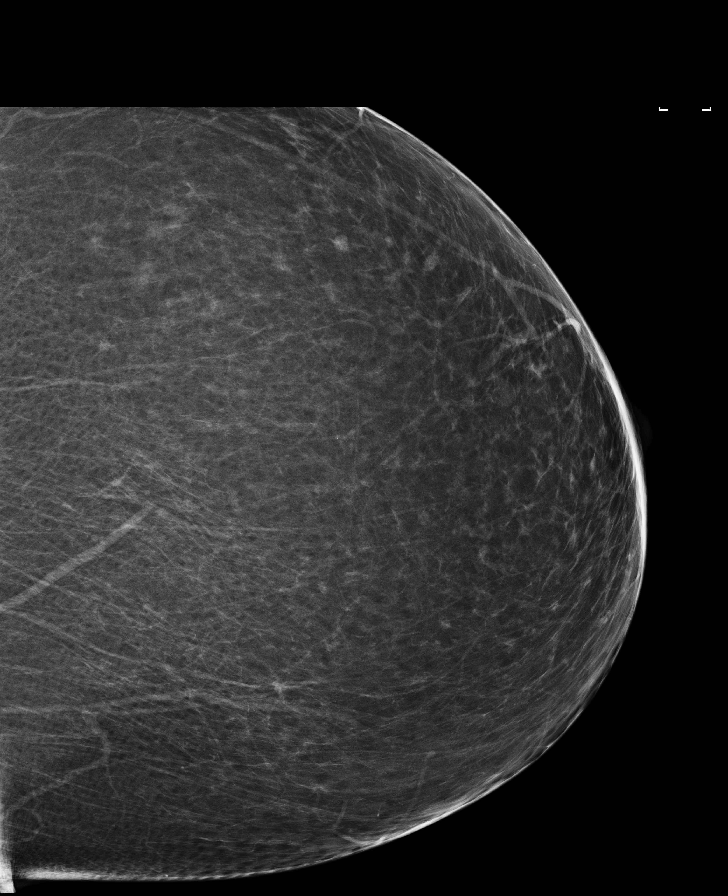

[L MLO synth-2D]
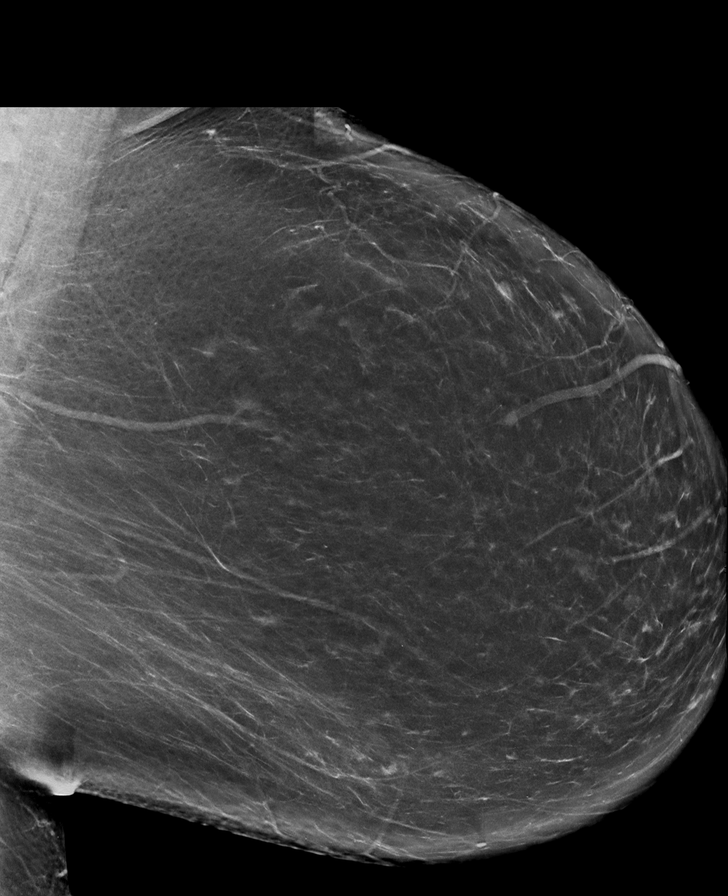

[R CC (2 of 2)]
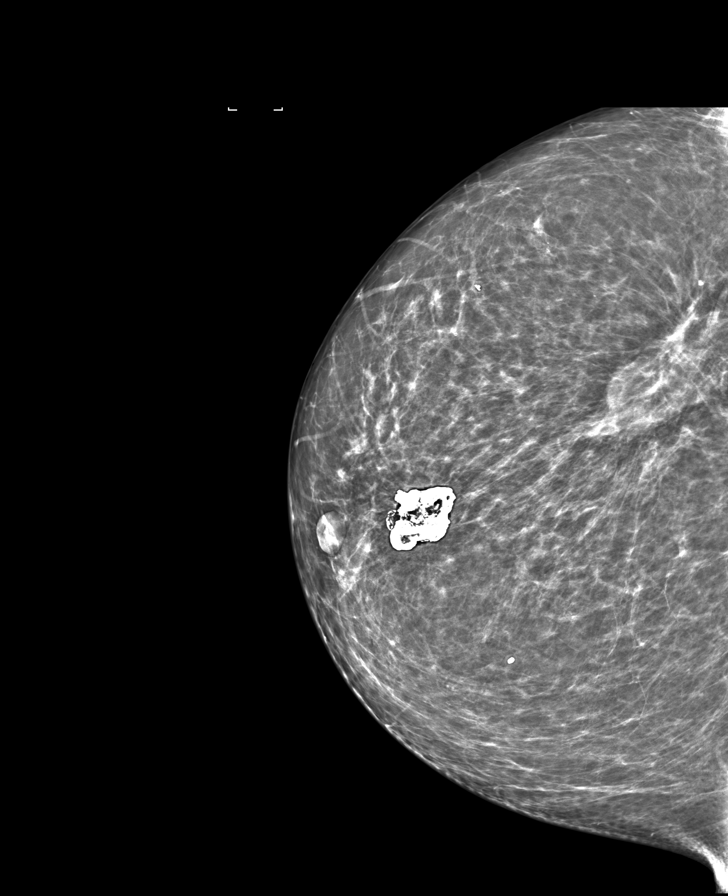

[R MLO (2 of 2)]
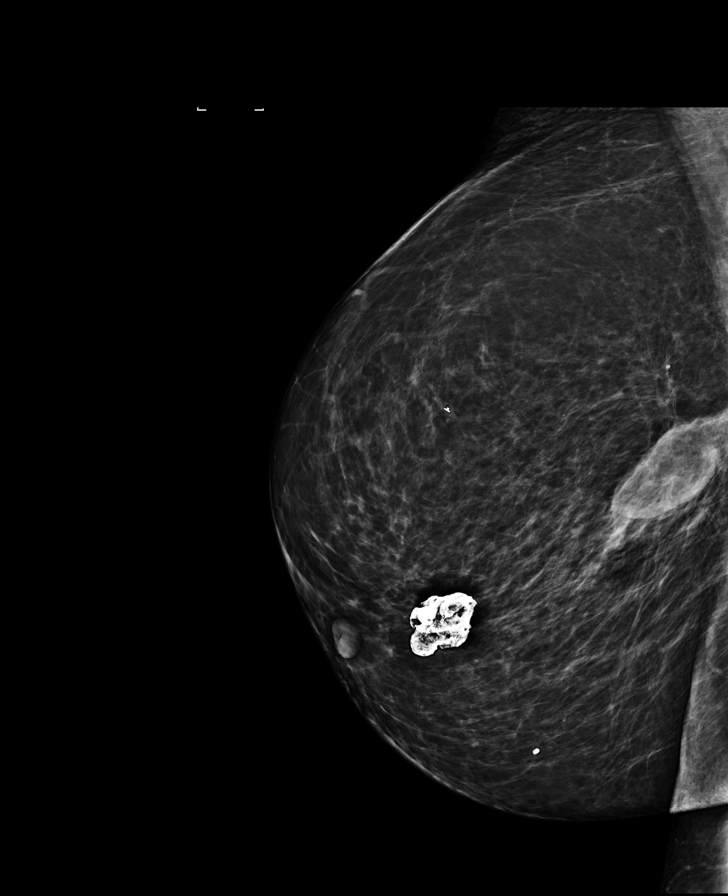

[8 of 31 positions shown; findings below may reference images not displayed]

FINDINGS: There are stable postsurgical changes within the right breast. There
are stable benign calcifications adjacent to the lumpectomy site. No
new dominant masses, suspicious calcifications or secondary signs of
malignancy are identified within the right breast.

The low density nodule described within the anterior left breast on
previous mammograms is stable, or slightly smaller, compared to
previous exams. There are no new dominant masses, suspicious
calcifications or secondary signs of malignancy within the left
breast.

Mammographic images were processed with CAD.
IMPRESSION: No mammographic evidence of malignancy within either breast. Stable
postsurgical changes within the right breast related to lumpectomy
in 5333. Additional stable benign findings within each breast as
described above.

RECOMMENDATION:
Bilateral diagnostic mammogram in 1 year.

I have discussed the findings and recommendations with the patient.
Results were also provided in writing at the conclusion of the
visit. If applicable, a reminder letter will be sent to the patient
regarding the next appointment.

BI-RADS CATEGORY  2: Benign.

## 2017-04-17 ENCOUNTER — Ambulatory Visit
Admission: RE | Admit: 2017-04-17 | Discharge: 2017-04-17 | Disposition: A | Discharge status: Home / Self Care | Payer: 59 | Source: Ambulatory Visit | Attending: Oncology | Admitting: Oncology

## 2017-04-17 DIAGNOSIS — Z170 Estrogen receptor positive status [ER+]: Secondary | ICD-10-CM

## 2017-04-17 DIAGNOSIS — Z1231 Encounter for screening mammogram for malignant neoplasm of breast: Secondary | ICD-10-CM | POA: Insufficient documentation

## 2017-04-17 DIAGNOSIS — C50911 Malignant neoplasm of unspecified site of right female breast: Secondary | ICD-10-CM

## 2017-06-23 ENCOUNTER — Ambulatory Visit: Payer: 59 | Admitting: Oncology

## 2017-06-23 ENCOUNTER — Other Ambulatory Visit: Payer: 59

## 2017-06-25 ENCOUNTER — Other Ambulatory Visit: Payer: Self-pay | Admitting: *Deleted

## 2017-06-25 DIAGNOSIS — C50919 Malignant neoplasm of unspecified site of unspecified female breast: Secondary | ICD-10-CM

## 2017-06-25 NOTE — Progress Notes (Signed)
[place]  Telephone:(336) [phone] [phone]) [phone]  ID: Heather Mcpherson OB: 1956/06/07  MR#: [id number]  NOI#:[id number]  Patient Care Team: Glendon Axe, MD as PCP - General (Internal Medicine)  CHIEF COMPLAINT: Pathologic stage Ia ER/PR/HER-2 + invasive carcinoma of the right breast, unspecified site.  INTERVAL HISTORY: Patient returns to clinic today for routine yearly evaluation.  She continues to feel well and is asymptomatic. She has no neurologic complaints.  She denies any fevers, chills, or weight loss. She denies any chest pain or shortness of breath.  She denies any bony pain. She denies any nausea, vomiting, constipation, or diarrhea.  She has no urinary complaints.  Patient offers no specific complaints today.  REVIEW OF SYSTEMS:   Review of Systems  Constitutional: Negative for fever, malaise/fatigue and weight loss.  Respiratory: Negative.  Negative for cough and shortness of breath.   Cardiovascular: Negative.  Negative for chest pain and leg swelling.  Gastrointestinal: Negative.  Negative for abdominal pain.  Genitourinary: Negative.   Musculoskeletal: Negative.  Negative for joint pain.  Skin: Negative.  Negative for rash.  Neurological: Negative.  Negative for weakness.  Psychiatric/Behavioral: Negative.  The patient is not nervous/anxious.     As per HPI. Otherwise, a complete review of systems is negative.  PAST MEDICAL HISTORY: Past Medical History:  Diagnosis Date  . Ankle edema   . Anxiety   . Breast cancer [facility]) 2009   right breast, chemo and radiation  . Cancer ([place])    breast  . Chronic kidney disease    UTI  . Depression   . GERD (gastroesophageal reflux disease)   . Hypercholesteremia   . Hypothyroidism   . Thyroid disease     PAST SURGICAL HISTORY: Past Surgical History:  Procedure Laterality Date  . ABDOMINAL HYSTERECTOMY    . BREAST BIOPSY Right 2011   negative, stereo  . BREAST BIOPSY Right 2014   negative, stereo  . BREAST EXCISIONAL BIOPSY Right 2009   positive  . BREAST SURGERY Right Oct 23, 2007   Lumpectomy  . COLONOSCOPY WITH PROPOFOL N/A 01/19/2015   Procedure: COLONOSCOPY WITH PROPOFOL;  Surgeon: Josefine Class, MD;  Location: [facility] ENDOSCOPY;  Service: Endoscopy;  Laterality: N/A;  . evacuation of hematoma right breast    . EYE SURGERY     cataracts  . JOINT REPLACEMENT Right July 14, 2013   Right Knee Replacement  . RETINAL DETACHMENT SURGERY Left September 2007  . surgery on heel, foot elbow    . TOTAL KNEE ARTHROPLASTY Left 04/19/2015   Procedure: TOTAL KNEE ARTHROPLASTY;  Surgeon: Earnestine Leys, MD;  Location: [facility] ORS;  Service: Orthopedics;  Laterality: Left;    FAMILY HISTORY: Reviewed and unchanged. No reported history of malignancy or chronic disease.     ADVANCED DIRECTIVES:    HEALTH MAINTENANCE: Social History   Tobacco Use  . Smoking status: Passive Smoke Exposure - Never Smoker  . Smokeless tobacco: Never Used  Substance Use Topics  . Alcohol use: No    Alcohol/week: 0.0 oz  . Drug use: No     Colonoscopy:  PAP:  Bone density:  Lipid panel:  No Known Allergies  Current Outpatient Medications  Medication Sig Dispense Refill  . acetaminophen (TYLENOL ARTHRITIS PAIN) 650 MG CR tablet Take 1,300 mg by mouth 2 (two) times daily.    Marland Kitchen atorvastatin (LIPITOR) 10 MG tablet Take 10 mg by mouth at bedtime.     . celecoxib (CELEBREX) 200 MG capsule Take 200  mg by mouth daily.     . cetirizine (ZYRTEC) 10 MG tablet Take 10 mg by mouth daily.    . cholecalciferol (VITAMIN D) 1000 UNITS tablet Take 1,000 Units by mouth 2 (two) times daily.    . Magnesium 200 MG TABS Take 1 tablet by mouth daily.     Earney Navy Bicarbonate (ZEGERID) 20-1100 MG CAPS capsule Take 1 capsule by mouth daily before breakfast.     . SYNTHROID 112 MCG tablet Take 112 mcg by mouth daily before breakfast.     . venlafaxine XR (EFFEXOR-XR) 150 MG 24 hr capsule  Take 150 mg by mouth daily.     Marland Kitchen venlafaxine XR (EFFEXOR-XR) 75 MG 24 hr capsule      No current facility-administered medications for this visit.     OBJECTIVE: Vitals:   06/26/17 1118  BP: 138/87  Pulse: 79  Resp: 20  Temp: (!) 97.3 F (36.3 C)     Body mass index is 38.65 kg/m.    ECOG FS:0 - Asymptomatic  General: Well-developed, well-nourished, no acute distress. Eyes: Pink conjunctiva, anicteric sclera. Breasts: Bilateral breast and axilla without lumps or masses.  Lungs: Clear to auscultation bilaterally. Heart: Regular rate and rhythm. No rubs, murmurs, or gallops. Abdomen: Soft, nontender, nondistended. No organomegaly noted, normoactive bowel sounds. Musculoskeletal: No edema, cyanosis, or clubbing. Neuro: Alert, answering all questions appropriately. Cranial nerves grossly intact. Skin: No rashes or petechiae noted. Psych: Normal affect.   LAB RESULTS:  Lab Results  Component Value Date   NA 138 04/20/2015   K 3.9 04/20/2015   CL 102 04/20/2015   CO2 30 04/20/2015   GLUCOSE 142 (H) 04/20/2015   BUN 13 04/20/2015   CREATININE 0.72 04/20/2015   CALCIUM 8.9 04/20/2015   PROT 7.2 12/01/2011   ALBUMIN 3.9 12/01/2011   AST 29 12/01/2011   ALT 27 12/01/2011   ALKPHOS 80 12/01/2011   BILITOT 0.4 12/01/2011   GFRNONAA >60 04/20/2015   GFRAA >60 04/20/2015    Lab Results  Component Value Date   WBC 5.5 04/22/2015   NEUTROABS 6.4 07/15/2013   HGB 11.0 (L) 04/22/2015   HCT 31.6 (L) 04/22/2015   MCV 90.0 04/22/2015   PLT 163 04/22/2015     STUDIES: No results found.  ASSESSMENT: Pathologic stage Ia ER/PR/HER-2 + invasive carcinoma of the right breast, unspecified site.  PLAN:    1. Pathologic stage Ia ER/PR/HER-2 + invasive carcinoma of the right breast, unspecified site: No evidence of disease. Patient completed her year-long of Herceptin on Nov 10, 2008. She has also completed 5 years of letrozole.  Patient's most recent mammogram on April 17, 2017 was reported as BI-RADS 2.  Repeat in October 2019.  After lengthy discussion with the patient, she is nearly 10 years removed from her treatment and she has requested no further follow-up.  She has been instructed to keep her mammogram appointment as scheduled and then continue to follow-up with her primary care physician.  Please refer patient back if there are any questions or concerns.  Approximately 20 minutes was spent in discussion of which greater than 50% was consultation.  Patient expressed understanding and was in agreement with this plan. She also understands that She can call clinic at any time with any questions, concerns, or complaints.    Lloyd Huger, MD 06/27/17 3:02 PM

## 2017-06-26 ENCOUNTER — Inpatient Hospital Stay: Payer: 59 | Attending: Oncology

## 2017-06-26 ENCOUNTER — Inpatient Hospital Stay (HOSPITAL_BASED_OUTPATIENT_CLINIC_OR_DEPARTMENT_OTHER): Payer: 59 | Admitting: Oncology

## 2017-06-26 ENCOUNTER — Encounter: Payer: Self-pay | Admitting: Oncology

## 2017-06-26 ENCOUNTER — Other Ambulatory Visit: Payer: Self-pay

## 2017-06-26 VITALS — BP 138/87 | HR 79 | Temp 97.3°F | Resp 20 | Wt 254.2 lb

## 2017-06-26 DIAGNOSIS — Z90710 Acquired absence of both cervix and uterus: Secondary | ICD-10-CM | POA: Diagnosis not present

## 2017-06-26 DIAGNOSIS — Z87440 Personal history of urinary (tract) infections: Secondary | ICD-10-CM | POA: Diagnosis not present

## 2017-06-26 DIAGNOSIS — E039 Hypothyroidism, unspecified: Secondary | ICD-10-CM | POA: Insufficient documentation

## 2017-06-26 DIAGNOSIS — R609 Edema, unspecified: Secondary | ICD-10-CM | POA: Diagnosis not present

## 2017-06-26 DIAGNOSIS — Z79899 Other long term (current) drug therapy: Secondary | ICD-10-CM | POA: Insufficient documentation

## 2017-06-26 DIAGNOSIS — Z853 Personal history of malignant neoplasm of breast: Secondary | ICD-10-CM | POA: Diagnosis present

## 2017-06-26 DIAGNOSIS — F419 Anxiety disorder, unspecified: Secondary | ICD-10-CM | POA: Insufficient documentation

## 2017-06-26 DIAGNOSIS — Z923 Personal history of irradiation: Secondary | ICD-10-CM | POA: Insufficient documentation

## 2017-06-26 DIAGNOSIS — Z170 Estrogen receptor positive status [ER+]: Principal | ICD-10-CM

## 2017-06-26 DIAGNOSIS — Z9221 Personal history of antineoplastic chemotherapy: Secondary | ICD-10-CM | POA: Diagnosis not present

## 2017-06-26 DIAGNOSIS — K219 Gastro-esophageal reflux disease without esophagitis: Secondary | ICD-10-CM

## 2017-06-26 DIAGNOSIS — E7800 Pure hypercholesterolemia, unspecified: Secondary | ICD-10-CM | POA: Insufficient documentation

## 2017-06-26 DIAGNOSIS — F329 Major depressive disorder, single episode, unspecified: Secondary | ICD-10-CM

## 2017-06-26 DIAGNOSIS — C50919 Malignant neoplasm of unspecified site of unspecified female breast: Secondary | ICD-10-CM

## 2017-06-26 DIAGNOSIS — C50911 Malignant neoplasm of unspecified site of right female breast: Secondary | ICD-10-CM

## 2017-06-26 NOTE — Progress Notes (Signed)
Patient denies any concerns today.  

## 2017-06-27 LAB — CANCER ANTIGEN 27.29: CAN 27.29: 15.7 U/mL (ref 0.0–38.6)

## 2018-04-20 ENCOUNTER — Ambulatory Visit
Admission: RE | Admit: 2018-04-20 | Discharge: 2018-04-20 | Disposition: A | Discharge status: Home / Self Care | Payer: 59 | Source: Ambulatory Visit | Attending: Oncology | Admitting: Oncology

## 2018-04-20 DIAGNOSIS — C50911 Malignant neoplasm of unspecified site of right female breast: Secondary | ICD-10-CM

## 2018-04-20 DIAGNOSIS — Z170 Estrogen receptor positive status [ER+]: Secondary | ICD-10-CM | POA: Diagnosis present

## 2018-04-21 ENCOUNTER — Ambulatory Visit: Payer: 59

## 2018-07-16 ENCOUNTER — Other Ambulatory Visit: Payer: Self-pay | Admitting: Internal Medicine

## 2018-07-16 DIAGNOSIS — Z1231 Encounter for screening mammogram for malignant neoplasm of breast: Secondary | ICD-10-CM

## 2019-01-20 ENCOUNTER — Other Ambulatory Visit: Payer: Self-pay | Admitting: Internal Medicine

## 2019-01-20 DIAGNOSIS — Z1231 Encounter for screening mammogram for malignant neoplasm of breast: Secondary | ICD-10-CM

## 2019-04-16 ENCOUNTER — Other Ambulatory Visit: Payer: Self-pay | Admitting: Specialist

## 2019-04-19 ENCOUNTER — Encounter
Admission: RE | Admit: 2019-04-19 | Discharge: 2019-04-19 | Disposition: A | Discharge status: Home / Self Care | Payer: Medicare Other | Source: Ambulatory Visit | Attending: Specialist | Admitting: Specialist

## 2019-04-19 ENCOUNTER — Other Ambulatory Visit: Payer: Self-pay

## 2019-04-19 DIAGNOSIS — Z01810 Encounter for preprocedural cardiovascular examination: Secondary | ICD-10-CM | POA: Diagnosis not present

## 2019-04-19 DIAGNOSIS — I4510 Unspecified right bundle-branch block: Secondary | ICD-10-CM | POA: Insufficient documentation

## 2019-04-19 DIAGNOSIS — R9431 Abnormal electrocardiogram [ECG] [EKG]: Secondary | ICD-10-CM | POA: Diagnosis not present

## 2019-04-19 HISTORY — DX: Nausea with vomiting, unspecified: R11.2

## 2019-04-19 HISTORY — DX: Other seasonal allergic rhinitis: J30.2

## 2019-04-19 HISTORY — DX: Other specified postprocedural states: Z98.890

## 2019-04-19 HISTORY — DX: Sleep apnea, unspecified: G47.30

## 2019-04-19 NOTE — Patient Instructions (Addendum)
Your procedure is scheduled on: Monday 11/2 Report to Day Surgery. To find out your arrival time please call [phone] between 1PM - 3PM on Friday 10/30.  Remember: Instructions that are not followed completely may result in serious medical risk,  up to and including death, or upon the discretion of your surgeon and anesthesiologist your  surgery may need to be rescheduled.     _X__ 1. Do not eat food after midnight the night before your procedure.                 No gum chewing or hard candies. You may drink clear liquids up to 2 hours                 before you are scheduled to arrive for your surgery- DO not drink clear                 liquids within 2 hours of the start of your surgery.                 Clear Liquids include:  water, apple juice without pulp, clear carbohydrate                 drink such as Clearfast of Gatorade, Black Coffee or Tea (Do not add                 anything to coffee or tea).  __X__2.  On the morning of surgery brush your teeth with toothpaste and water, you                may rinse your mouth with mouthwash if you wish.  Do not swallow any toothpaste of mouthwash.     __ 3.  No Alcohol for 24 hours before or after surgery.  ___ 4.  Do Not Smoke or use e-cigarettes For 24 Hours Prior to Your Surgery.                 Do not use any chewable tobacco products for at least 6 hours prior to                 surgery.  ____  5.  Bring all medications with you on the day of surgery if instructed.   __x__  6.  Notify your doctor if there is any change in your medical condition      (cold, fever, infections).     Do not wear jewelry, make-up, hairpins, clips or nail polish. Do not wear lotions, powders, or perfumes. You may wear deodorant. Do not shave 48 hours prior to surgery. Men may shave face and neck. Do not bring valuables to the hospital.    [facility] is not responsible for any belongings or valuables.  Contacts, dentures  or bridgework may not be worn into surgery. Leave your suitcase in the car. After surgery it may be brought to your room. For patients admitted to the hospital, discharge time is determined by your treatment team.   Patients discharged the day of surgery will not be allowed to drive home.   Please read over the following fact sheets that you were given:    __x__ Take these medicines the morning of surgery with A SIP OF WATER:    1. acetaminophen (TYLENOL ARTHRITIS PAIN) 650 MG CR tablet  If needed  2. Dexlansoprazole (DEXILANT) 30 MG capsule  Dose the night before the surgery and one the morning of surgery  3. fluticasone (FLONASE) 50  MCG/ACT nasal spray  4.SYNTHROID 112 MCG tablet  5.venlafaxine XR (EFFEXOR-XR) 150 MG 24 hr capsule  6.celecoxib (CELEBREX) 200 MG capsule  ____ Fleet Enema (as directed)   __x__ Use CHG Soap as directed  ____ Use inhalers on the day of surgery  ____ Stop metformin 2 days prior to surgery    ____ Take 1/2 of usual insulin dose the night before surgery. No insulin the morning          of surgery.   ____ Stop Coumadin/Plavix/aspirin on   __x__ Stop Anti-inflammatories no ibuprofen or aleve   ____ Stop supplements until after surgery.    ____ Bring C-Pap to the hospital.

## 2019-04-22 ENCOUNTER — Other Ambulatory Visit
Admission: RE | Admit: 2019-04-22 | Discharge: 2019-04-22 | Disposition: A | Discharge status: Home / Self Care | Payer: Medicare Other | Source: Ambulatory Visit | Attending: Specialist | Admitting: Specialist

## 2019-04-22 ENCOUNTER — Other Ambulatory Visit: Payer: Self-pay

## 2019-04-22 ENCOUNTER — Ambulatory Visit: Payer: 59

## 2019-04-22 DIAGNOSIS — Z20828 Contact with and (suspected) exposure to other viral communicable diseases: Secondary | ICD-10-CM | POA: Diagnosis not present

## 2019-04-22 DIAGNOSIS — Z01812 Encounter for preprocedural laboratory examination: Secondary | ICD-10-CM | POA: Insufficient documentation

## 2019-04-22 LAB — SARS CORONAVIRUS 2 (TAT 6-24 HRS): SARS Coronavirus 2: NEGATIVE

## 2019-04-23 MED ORDER — FAMOTIDINE 20 MG PO TABS: 20.0000 mg | ORAL_TABLET | Freq: Once | ORAL | Status: DC

## 2019-04-26 ENCOUNTER — Other Ambulatory Visit: Payer: Self-pay

## 2019-04-26 ENCOUNTER — Ambulatory Visit: Payer: Medicare Other | Admitting: Certified Registered Nurse Anesthetist

## 2019-04-26 ENCOUNTER — Encounter
Admission: RE | Disposition: A | Discharge status: Home / Self Care | Payer: Self-pay | Source: Home / Self Care | Attending: Specialist

## 2019-04-26 ENCOUNTER — Ambulatory Visit
Admission: RE | Admit: 2019-04-26 | Discharge: 2019-04-26 | Disposition: A | Discharge status: Home / Self Care | Payer: Medicare Other | Attending: Specialist | Admitting: Specialist

## 2019-04-26 DIAGNOSIS — G4730 Sleep apnea, unspecified: Secondary | ICD-10-CM | POA: Insufficient documentation

## 2019-04-26 DIAGNOSIS — N189 Chronic kidney disease, unspecified: Secondary | ICD-10-CM | POA: Diagnosis not present

## 2019-04-26 DIAGNOSIS — Z853 Personal history of malignant neoplasm of breast: Secondary | ICD-10-CM | POA: Insufficient documentation

## 2019-04-26 DIAGNOSIS — F419 Anxiety disorder, unspecified: Secondary | ICD-10-CM | POA: Insufficient documentation

## 2019-04-26 DIAGNOSIS — M205X1 Other deformities of toe(s) (acquired), right foot: Secondary | ICD-10-CM | POA: Insufficient documentation

## 2019-04-26 DIAGNOSIS — K219 Gastro-esophageal reflux disease without esophagitis: Secondary | ICD-10-CM | POA: Insufficient documentation

## 2019-04-26 DIAGNOSIS — E7800 Pure hypercholesterolemia, unspecified: Secondary | ICD-10-CM | POA: Diagnosis not present

## 2019-04-26 DIAGNOSIS — Z981 Arthrodesis status: Secondary | ICD-10-CM | POA: Insufficient documentation

## 2019-04-26 DIAGNOSIS — E039 Hypothyroidism, unspecified: Secondary | ICD-10-CM | POA: Diagnosis not present

## 2019-04-26 DIAGNOSIS — F329 Major depressive disorder, single episode, unspecified: Secondary | ICD-10-CM | POA: Diagnosis not present

## 2019-04-26 HISTORY — PX: HAMMERTOE RECONSTRUCTION WITH WEIL OSTEOTOMY: SHX5631

## 2019-04-26 SURGERY — HAMMERTOE RECONSTRUCTION WITH WEIL OSTEOTOMY
Anesthesia: General | Site: Second Toe | Laterality: Right

## 2019-04-26 MED ORDER — FENTANYL CITRATE (PF) 100 MCG/2ML IJ SOLN
INTRAMUSCULAR | Status: DC | PRN
  Administered 2019-04-26: 25 ug via INTRAVENOUS
  Administered 2019-04-26: 50 ug via INTRAVENOUS
  Administered 2019-04-26: 25 ug via INTRAVENOUS

## 2019-04-26 MED ORDER — HYDROCODONE-ACETAMINOPHEN 5-325 MG PO TABS: 1.0000 | ORAL_TABLET | Freq: Four times a day (QID) | ORAL | 0 refills | Status: DC | PRN

## 2019-04-26 MED ORDER — CLINDAMYCIN PHOSPHATE 600 MG/50ML IV SOLN: 600.0000 mg | INTRAVENOUS | Status: DC

## 2019-04-26 MED ORDER — ONDANSETRON HCL 4 MG/2ML IJ SOLN
INTRAMUSCULAR | Status: DC | PRN
  Administered 2019-04-26: 4 mg via INTRAVENOUS

## 2019-04-26 MED ORDER — PROPOFOL 10 MG/ML IV BOLUS
INTRAVENOUS | Status: DC | PRN
  Administered 2019-04-26: 40 mg via INTRAVENOUS
  Administered 2019-04-26 (×2): 24 mg via INTRAVENOUS

## 2019-04-26 MED ORDER — CLINDAMYCIN PHOSPHATE 600 MG/50ML IV SOLN
INTRAVENOUS | Status: AC
  Filled 2019-04-26: qty 50

## 2019-04-26 MED ORDER — PROPOFOL 500 MG/50ML IV EMUL
INTRAVENOUS | Status: DC | PRN
  Administered 2019-04-26: 50 ug/kg/min via INTRAVENOUS

## 2019-04-26 MED ORDER — GABAPENTIN 300 MG PO CAPS
ORAL_CAPSULE | ORAL | Status: AC
  Filled 2019-04-26: qty 1

## 2019-04-26 MED ORDER — CLINDAMYCIN PHOSPHATE 600 MG/50ML IV SOLN
600.0000 mg | INTRAVENOUS | Status: AC
  Administered 2019-04-26: 600 mg via INTRAVENOUS

## 2019-04-26 MED ORDER — CHLORHEXIDINE GLUCONATE CLOTH 2 % EX PADS: 6.0000 | MEDICATED_PAD | Freq: Once | CUTANEOUS | Status: DC

## 2019-04-26 MED ORDER — ONDANSETRON HCL 4 MG/2ML IJ SOLN
INTRAMUSCULAR | Status: AC
  Filled 2019-04-26: qty 2

## 2019-04-26 MED ORDER — BUPIVACAINE HCL (PF) 0.5 % IJ SOLN
INTRAMUSCULAR | Status: AC
  Filled 2019-04-26: qty 30

## 2019-04-26 MED ORDER — MIDAZOLAM HCL 2 MG/2ML IJ SOLN
INTRAMUSCULAR | Status: AC
  Filled 2019-04-26: qty 2

## 2019-04-26 MED ORDER — ONDANSETRON HCL 4 MG/2ML IJ SOLN: 4.0000 mg | Freq: Once | INTRAMUSCULAR | Status: DC | PRN

## 2019-04-26 MED ORDER — PROPOFOL 500 MG/50ML IV EMUL
INTRAVENOUS | Status: AC
  Filled 2019-04-26: qty 50

## 2019-04-26 MED ORDER — FENTANYL CITRATE (PF) 100 MCG/2ML IJ SOLN
INTRAMUSCULAR | Status: AC
  Filled 2019-04-26: qty 2

## 2019-04-26 MED ORDER — MIDAZOLAM HCL 2 MG/2ML IJ SOLN
INTRAMUSCULAR | Status: DC | PRN
  Administered 2019-04-26: 2 mg via INTRAVENOUS

## 2019-04-26 MED ORDER — DEXAMETHASONE SODIUM PHOSPHATE 10 MG/ML IJ SOLN
INTRAMUSCULAR | Status: AC
  Filled 2019-04-26: qty 1

## 2019-04-26 MED ORDER — DEXAMETHASONE SODIUM PHOSPHATE 4 MG/ML IJ SOLN
INTRAMUSCULAR | Status: DC | PRN
  Administered 2019-04-26: 10 mg via INTRAVENOUS

## 2019-04-26 MED ORDER — CEFAZOLIN SODIUM-DEXTROSE 2-4 GM/100ML-% IV SOLN
INTRAVENOUS | Status: AC
  Filled 2019-04-26: qty 100

## 2019-04-26 MED ORDER — GABAPENTIN 300 MG PO CAPS: 300.0000 mg | ORAL_CAPSULE | ORAL | Status: DC

## 2019-04-26 MED ORDER — SUGAMMADEX SODIUM 200 MG/2ML IV SOLN
INTRAVENOUS | Status: AC
  Filled 2019-04-26: qty 2

## 2019-04-26 MED ORDER — BUPIVACAINE HCL 0.5 % IJ SOLN
INTRAMUSCULAR | Status: DC | PRN
  Administered 2019-04-26: 15 mL

## 2019-04-26 MED ORDER — LIDOCAINE HCL (PF) 2 % IJ SOLN
INTRAMUSCULAR | Status: AC
  Filled 2019-04-26: qty 10

## 2019-04-26 MED ORDER — GABAPENTIN 400 MG PO CAPS: 400.0000 mg | ORAL_CAPSULE | Freq: Three times a day (TID) | ORAL | 3 refills | Status: DC

## 2019-04-26 MED ORDER — LACTATED RINGERS IV SOLN
INTRAVENOUS | Status: DC
  Administered 2019-04-26: 11:00:00 via INTRAVENOUS

## 2019-04-26 MED ORDER — FENTANYL CITRATE (PF) 100 MCG/2ML IJ SOLN: 25.0000 ug | INTRAMUSCULAR | Status: DC | PRN

## 2019-04-26 MED ORDER — LIDOCAINE HCL (CARDIAC) PF 100 MG/5ML IV SOSY
PREFILLED_SYRINGE | INTRAVENOUS | Status: DC | PRN
  Administered 2019-04-26: 100 mg via INTRAVENOUS

## 2019-04-26 MED ORDER — CEFAZOLIN SODIUM-DEXTROSE 2-4 GM/100ML-% IV SOLN
2.0000 g | INTRAVENOUS | Status: AC
  Administered 2019-04-26: 2 g via INTRAVENOUS

## 2019-04-26 SURGICAL SUPPLY — 46 items
"PENCIL ELECTRO HAND CTR " (MISCELLANEOUS) ×1 IMPLANT
BLADE CRESCENTIC (BLADE) ×3 IMPLANT
BLADE MED AGGRESSIVE (BLADE) ×3 IMPLANT
BLADE OSC/SAGITTAL MD 5.5X18 (BLADE) ×1 IMPLANT
BLADE OSCILLATING/SAGITTAL (BLADE) ×2
BLADE SURG MINI STRL (BLADE) ×1 IMPLANT
BLADE SW THK.38XMED LNG THN (BLADE) ×1 IMPLANT
BNDG CONFORM 3 STRL LF (GAUZE/BANDAGES/DRESSINGS) ×3 IMPLANT
BNDG ELASTIC 4X5.8 VLCR NS LF (GAUZE/BANDAGES/DRESSINGS) ×3 IMPLANT
BNDG ESMARK 4X12 TAN STRL LF (GAUZE/BANDAGES/DRESSINGS) ×3 IMPLANT
BNDG GAUZE 4.5X4.1 6PLY STRL (MISCELLANEOUS) ×3 IMPLANT
CANISTER SUCT 1200ML W/VALVE (MISCELLANEOUS) ×3 IMPLANT
CLOSURE WOUND 1/4X4 (GAUZE/BANDAGES/DRESSINGS)
COVER PIN YLW 0.028-062 (MISCELLANEOUS) ×4 IMPLANT
COVER WAND RF STERILE (DRAPES) ×3 IMPLANT
CUFF TOURN SGL QUICK 24 (TOURNIQUET CUFF)
CUFF TRNQT CYL 24X4X16.5-23 (TOURNIQUET CUFF) IMPLANT
DRAPE FLUOR MINI C-ARM 54X84 (DRAPES) ×3 IMPLANT
DURAPREP 26ML APPLICATOR (WOUND CARE) ×3 IMPLANT
ELECT REM PT RETURN 9FT ADLT (ELECTROSURGICAL) ×3
ELECTRODE REM PT RTRN 9FT ADLT (ELECTROSURGICAL) ×1 IMPLANT
GAUZE SPONGE 4X4 12PLY STRL (GAUZE/BANDAGES/DRESSINGS) ×3 IMPLANT
GAUZE XEROFORM 1X8 LF (GAUZE/BANDAGES/DRESSINGS) ×3 IMPLANT
GLOVE BIO SURGEON STRL SZ7.5 (GLOVE) ×3 IMPLANT
GOWN STRL REUS W/ TWL LRG LVL3 (GOWN DISPOSABLE) ×1 IMPLANT
GOWN STRL REUS W/TWL LRG LVL3 (GOWN DISPOSABLE) ×2
GOWN STRL REUS W/TWL LRG LVL4 (GOWN DISPOSABLE) ×3 IMPLANT
KIT TURNOVER KIT A (KITS) ×3 IMPLANT
LABEL OR SOLS (LABEL) ×3 IMPLANT
NDL FILTER BLUNT 18X1 1/2 (NEEDLE) ×1 IMPLANT
NEEDLE FILTER BLUNT 18X 1/2SAF (NEEDLE) ×2
NEEDLE FILTER BLUNT 18X1 1/2 (NEEDLE) ×1 IMPLANT
NS IRRIG 500ML POUR BTL (IV SOLUTION) ×3 IMPLANT
PACK EXTREMITY ARMC (MISCELLANEOUS) ×3 IMPLANT
PAD PREP 24X41 OB/GYN DISP (PERSONAL CARE ITEMS) ×3 IMPLANT
PENCIL ELECTRO HAND CTR (MISCELLANEOUS) ×3 IMPLANT
RASP SM TEAR CROSS CUT (RASP) ×3 IMPLANT
STOCKINETTE STRL 6IN 960660 (GAUZE/BANDAGES/DRESSINGS) ×3 IMPLANT
STRIP CLOSURE SKIN 1/4X4 (GAUZE/BANDAGES/DRESSINGS) ×1 IMPLANT
SUT ETHILON 5-0 FS-2 18 BLK (SUTURE) ×3 IMPLANT
SUT VIC AB 3-0 SH 27 (SUTURE) ×4
SUT VIC AB 3-0 SH 27X BRD (SUTURE) ×2 IMPLANT
SUT VIC AB 4-0 FS2 27 (SUTURE) ×3 IMPLANT
SYR 10ML LL (SYRINGE) ×3 IMPLANT
WIRE Z .045 C-WIRE SPADE TIP (WIRE) ×1 IMPLANT
WIRE Z .062 C-WIRE SPADE TIP (WIRE) ×3 IMPLANT

## 2019-04-26 NOTE — Transfer of Care (Signed)
Immediate Anesthesia Transfer of Care Note  Patient: Heather Mcpherson  Procedure(s) Performed: release hammertoe right second toe (Right Second Toe)  Patient Location: PACU  Anesthesia Type:General  Level of Consciousness: awake, alert , oriented and patient cooperative  Airway & Oxygen Therapy: Patient Spontanous Breathing  Post-op Assessment: Report given to RN and Post -op Vital signs reviewed and stable  Post vital signs: Reviewed and stable  Last Vitals:  Vitals Value Taken Time  BP 121/79 04/26/19 1319  Temp 36.3 C 04/26/19 1319  Pulse 70 04/26/19 1321  Resp 15 04/26/19 1321  SpO2 96 % 04/26/19 1321  Vitals shown include unvalidated device data.  Last Pain:  Vitals:   04/26/19 1319  TempSrc:   PainSc: 0-No pain         Complications: No apparent anesthesia complications

## 2019-04-26 NOTE — Anesthesia Post-op Follow-up Note (Signed)
Anesthesia QCDR form completed.        

## 2019-04-26 NOTE — H&P (Signed)
THE PATIENT WAS SEEN PRIOR TO SURGERY TODAY.  HISTORY, ALLERGIES, HOME MEDICATIONS AND OPERATIVE PROCEDURE WERE REVIEWED. RISKS AND BENEFITS OF SURGERY DISCUSSED WITH PATIENT AGAIN.  NO CHANGES FROM INITIAL HISTORY AND PHYSICAL NOTED.    

## 2019-04-26 NOTE — Op Note (Signed)
04/26/2019  1:09 PM  PATIENT:  Heather Mcpherson  63 y.o. 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  The right foot and ankle were prepped and draped in sterile fashion.  Tourniquet at the ankle was inflated to 300 mmHg.  #11 scalpel blade was used to release the flexor tendon at the DIP joint volarly through a minimal incision.  The joint was extended and pinned with a 0.62 K wire.  Fluoroscopy showed good position of the wire.  The wire was bent and cut short and buried.  Skin was closed with 5-0 nylon.  Dry sterile dressing was applied.  Patient was aroused and taken to recovery in good condition.  She may be partial weightbearing on her heel.  Earnestine Leys, MD

## 2019-04-26 NOTE — Anesthesia Preprocedure Evaluation (Signed)
Anesthesia Evaluation  Patient identified by MRN, date of birth, ID band Patient awake    Reviewed: Allergy & Precautions, NPO status , Patient's Chart, lab work & pertinent test results  History of Anesthesia Complications (+) PONV and history of anesthetic complications  Airway Mallampati: II       Dental  (+) Dental Advidsory Given, Caps, Missing, Teeth Intact   Pulmonary neg shortness of breath, sleep apnea and Continuous Positive Airway Pressure Ventilation , neg COPD, neg recent URI,    Pulmonary exam normal        Cardiovascular negative cardio ROS Normal cardiovascular exam     Neuro/Psych PSYCHIATRIC DISORDERS Anxiety Depression    GI/Hepatic Neg liver ROS, GERD  Medicated,  Endo/Other  neg diabetesHypothyroidism Morbid obesity  Renal/GU CRFRenal disease     Musculoskeletal negative musculoskeletal ROS (+)   Abdominal Normal abdominal exam  (+)   Peds negative pediatric ROS (+)  Hematology negative hematology ROS (+)   Anesthesia Other Findings Past Medical History: No date: Ankle edema No date: Anxiety 2009: Breast cancer ([place])     Comment:  right breast, chemo and radiation No date: Cancer ([place])     Comment:  breast No date: Chronic kidney disease     Comment:  UTI No date: Depression No date: GERD (gastroesophageal reflux disease) No date: Hypercholesteremia No date: Hypothyroidism No date: PONV (postoperative nausea and vomiting)     Comment:  many years ago No date: Seasonal allergies No date: Sleep apnea     Comment:  CPAP No date: Thyroid disease   Reproductive/Obstetrics                             Anesthesia Physical  Anesthesia Plan  ASA: II  Anesthesia Plan: General   Post-op Pain Management:    Induction: Intravenous  PONV Risk Score and Plan: 4 or greater and Propofol infusion and TIVA  Airway Management Planned: Nasal Cannula and Natural  Airway  Additional Equipment:   Intra-op Plan:   Post-operative Plan: Extubation in OR  Informed Consent:   Plan Discussed with: CRNA  Anesthesia Plan Comments:         Anesthesia Quick Evaluation

## 2019-04-26 NOTE — Discharge Instructions (Signed)
AMBULATORY SURGERY  °DISCHARGE INSTRUCTIONS ° ° °1) The drugs that you were given will stay in your system until tomorrow so for the next 24 hours you should not: ° °A) Drive an automobile °B) Make any legal decisions °C) Drink any alcoholic beverage ° ° °2) You may resume regular meals tomorrow.  Today it is better to start with liquids and gradually work up to solid foods. ° °You may eat anything you prefer, but it is better to start with liquids, then soup and crackers, and gradually work up to solid foods. ° ° °3) Please notify your doctor immediately if you have any unusual bleeding, trouble breathing, redness and pain at the surgery site, drainage, fever, or pain not relieved by medication. ° °Please contact your physician with any problems or Same Day Surgery at [phone], Monday through Friday 6 am to 4 pm, or  at [place] [facility] number at [phone]. °

## 2019-04-27 ENCOUNTER — Encounter: Payer: Self-pay | Admitting: Specialist

## 2019-04-27 NOTE — Anesthesia Postprocedure Evaluation (Signed)
Anesthesia Post Note  Patient: Heather Mcpherson  Procedure(s) Performed: release hammertoe right second toe (Right Second Toe)  Patient location during evaluation: PACU Anesthesia Type: General Level of consciousness: awake and alert Pain management: pain level controlled Vital Signs Assessment: post-procedure vital signs reviewed and stable Respiratory status: spontaneous breathing, nonlabored ventilation, respiratory function stable and patient connected to nasal cannula oxygen Cardiovascular status: blood pressure returned to baseline and stable Postop Assessment: no apparent nausea or vomiting Anesthetic complications: no     Last Vitals:  Vitals:   04/26/19 1409 04/26/19 1425  BP: (!) 156/81 (!) 143/78  Pulse: 61 72  Resp: 18 18  Temp: (!) 36.4 C   SpO2: 95% 96%    Last Pain:  Vitals:   04/26/19 1425  TempSrc:   PainSc: 0-No pain                 Martha Clan

## 2019-05-19 ENCOUNTER — Ambulatory Visit
Admission: RE | Admit: 2019-05-19 | Discharge: 2019-05-19 | Disposition: A | Discharge status: Home / Self Care | Payer: Medicare Other | Source: Ambulatory Visit | Attending: Internal Medicine | Admitting: Internal Medicine

## 2019-05-19 DIAGNOSIS — Z1231 Encounter for screening mammogram for malignant neoplasm of breast: Secondary | ICD-10-CM | POA: Diagnosis present

## 2019-08-06 ENCOUNTER — Other Ambulatory Visit: Payer: Self-pay

## 2019-08-06 ENCOUNTER — Ambulatory Visit: Payer: Medicare Other | Attending: Internal Medicine

## 2019-08-06 DIAGNOSIS — Z23 Encounter for immunization: Secondary | ICD-10-CM | POA: Insufficient documentation

## 2019-08-06 NOTE — Progress Notes (Signed)
   Covid-19 Vaccination Clinic  Name:  Heather Mcpherson    MRN: [id number] DOB: 02/10/1956  08/06/2019  Heather Mcpherson was observed post Covid-19 immunization for 15 minutes without incidence. She was provided with Vaccine Information Sheet and instruction to access the V-Safe system.   Heather Mcpherson was instructed to call 911 with any severe reactions post vaccine: Marland Kitchen Difficulty breathing  . Swelling of your face and throat  . A fast heartbeat  . A bad rash all over your body  . Dizziness and weakness    Immunizations Administered    Name Date Dose VIS Date Route   Moderna COVID-19 Vaccine 08/06/2019 10:10 AM 0.5 mL 05/25/2019 Intramuscular   Manufacturer: Moderna   Lot: [id number]   [id number]

## 2019-09-06 ENCOUNTER — Ambulatory Visit: Payer: Medicare Other | Attending: Internal Medicine

## 2019-09-06 DIAGNOSIS — Z23 Encounter for immunization: Secondary | ICD-10-CM

## 2019-09-06 NOTE — Progress Notes (Signed)
   Covid-19 Vaccination Clinic  Name:  Heather Mcpherson    MRN: [id number] DOB: 08/27/1955  09/06/2019  [name] was observed post Covid-19 immunization for 15 minutes without incident. She was provided with Vaccine Information Sheet and instruction to access the V-Safe system.   [name] was instructed to call 911 with any severe reactions post vaccine: Marland Kitchen Difficulty breathing  . Swelling of face and throat  . A fast heartbeat  . A bad rash all over body  . Dizziness and weakness   Immunizations Administered    Name Date Dose VIS Date Route   Moderna COVID-19 Vaccine 09/06/2019 10:09 AM 0.5 mL 05/25/2019 Intramuscular   Manufacturer: Moderna   Lot: [id number]   [id number]

## 2020-04-17 ENCOUNTER — Other Ambulatory Visit: Payer: Self-pay | Admitting: Internal Medicine

## 2020-04-17 DIAGNOSIS — Z1231 Encounter for screening mammogram for malignant neoplasm of breast: Secondary | ICD-10-CM

## 2020-05-23 ENCOUNTER — Other Ambulatory Visit: Payer: Self-pay

## 2020-05-23 ENCOUNTER — Ambulatory Visit
Admission: RE | Admit: 2020-05-23 | Discharge: 2020-05-23 | Disposition: A | Discharge status: Home / Self Care | Payer: Medicare Other | Source: Ambulatory Visit | Attending: Internal Medicine | Admitting: Internal Medicine

## 2020-05-23 DIAGNOSIS — Z1231 Encounter for screening mammogram for malignant neoplasm of breast: Secondary | ICD-10-CM | POA: Diagnosis not present

## 2020-05-23 HISTORY — DX: Personal history of antineoplastic chemotherapy: Z92.21

## 2020-05-23 HISTORY — DX: Personal history of irradiation: Z92.3

## 2020-07-07 ENCOUNTER — Other Ambulatory Visit: Payer: Self-pay

## 2020-07-07 ENCOUNTER — Encounter: Payer: Self-pay | Admitting: Plastic Surgery

## 2020-07-07 ENCOUNTER — Ambulatory Visit (INDEPENDENT_AMBULATORY_CARE_PROVIDER_SITE_OTHER): Payer: Medicare Other | Admitting: Plastic Surgery

## 2020-07-07 VITALS — BP 145/88 | HR 86 | Ht 68.0 in | Wt 269.6 lb

## 2020-07-07 DIAGNOSIS — Z170 Estrogen receptor positive status [ER+]: Secondary | ICD-10-CM | POA: Diagnosis not present

## 2020-07-07 DIAGNOSIS — C50911 Malignant neoplasm of unspecified site of right female breast: Secondary | ICD-10-CM

## 2020-07-07 DIAGNOSIS — N6489 Other specified disorders of breast: Secondary | ICD-10-CM

## 2020-07-07 NOTE — Progress Notes (Signed)
Patient ID: Heather Mcpherson, female    DOB: 02-24-1956, 65 y.o.   MRN: [id number]   Chief Complaint  Patient presents with  . Advice Only  . Breast Problem    The patient is a 65 year old female here with a friend for evaluation of her breasts.  She had 2 partial mastectomies of the right breast at [facility] in 2009.  She was radiated and that ended around 2011.  She is 5 feet 8 inches tall and weighs 269 pounds.  She recalls that during the partial mastectomies she had fluid retention and therefore prefers a drain be used for surgery.  We explained that sometimes it is necessary but will lean towards placing 1.  She has extreme asymmetry of her breasts now with a inframammary fold distance of 32 cm on the right and 40 cm on the left.  This creates quite a bit of asymmetry with her bras, pain, discomfort, difficulty finding a proper fitting shirt and strain on her neck and back.  In summary neck and back pain with shoulder grooving is present.  Her last mammogram was November 2021 and we will get the results but they were negative.  She has also been seen by [facility] in [place] for her back pain.  She is a formal Tour manager.  I do not feel any lumps or bumps today.  She does have a lot of firmness from the radiation damage on the right breast.  I should be able to get off at least 500 g from the left breast.   Review of Systems  Constitutional: Positive for activity change. Negative for appetite change.  HENT: Negative.   Eyes: Negative.   Respiratory: Negative.  Negative for chest tightness.   Gastrointestinal: Positive for abdominal distention.  Endocrine: Negative.   Genitourinary: Negative.   Musculoskeletal: Positive for back pain and neck pain.  Hematological: Negative.   Psychiatric/Behavioral: Negative.     Past Medical History:  Diagnosis Date  . Ankle edema   . Anxiety   . Breast cancer [facility]) 2009   right breast, chemo and radiation  . Cancer ([place])    breast  .  Chronic kidney disease    UTI  . Depression   . GERD (gastroesophageal reflux disease)   . Hypercholesteremia   . Hypothyroidism   . Personal history of chemotherapy   . Personal history of radiation therapy   . PONV (postoperative nausea and vomiting)    many years ago  . Seasonal allergies   . Sleep apnea    CPAP  . Thyroid disease     Past Surgical History:  Procedure Laterality Date  . ABDOMINAL HYSTERECTOMY    . BREAST BIOPSY Right 2011   negative, stereo  . BREAST BIOPSY Right 2014   negative, stereo  . BREAST EXCISIONAL BIOPSY Right 2009   positive  . BREAST LUMPECTOMY Right 2009  . BREAST SURGERY Right Oct 23, 2007   Lumpectomy  . COLONOSCOPY WITH PROPOFOL N/A 01/19/2015   Procedure: COLONOSCOPY WITH PROPOFOL;  Surgeon: Josefine Class, MD;  Location: [facility] ENDOSCOPY;  Service: Endoscopy;  Laterality: N/A;  . evacuation of hematoma right breast    . EYE SURGERY Bilateral    cataracts  . HAMMERTOE RECONSTRUCTION WITH WEIL OSTEOTOMY Right 04/26/2019   Procedure: release hammertoe right second toe;  Surgeon: Earnestine Leys, MD;  Location: [facility] ORS;  Service: Orthopedics;  Laterality: Right;  . HEEL SPUR EXCISION Bilateral   . JOINT REPLACEMENT Right July 14, 2013   Right Knee Replacement  . RETINAL DETACHMENT SURGERY Left September 2007  . surgery on heel, foot elbow    . TOTAL KNEE ARTHROPLASTY Left 04/19/2015   Procedure: TOTAL KNEE ARTHROPLASTY;  Surgeon: Earnestine Leys, MD;  Location: [facility] ORS;  Service: Orthopedics;  Laterality: Left;      Current Outpatient Medications:  .  acetaminophen (TYLENOL) 650 MG CR tablet, Take 1,300 mg by mouth 2 (two) times daily., Disp: , Rfl:  .  atorvastatin (LIPITOR) 10 MG tablet, Take 10 mg by mouth at bedtime. , Disp: , Rfl:  .  carboxymethylcellulose (REFRESH PLUS) 0.5 % SOLN, Place 1 drop into both eyes 2 (two) times daily as needed (Dry eyes)., Disp: , Rfl:  .  celecoxib (CELEBREX) 200 MG capsule, Take 200 mg by mouth  daily. , Disp: , Rfl:  .  Cholecalciferol (VITAMIN D) 50 MCG (2000 UT) tablet, Take 2,000 Units by mouth 2 (two) times daily. Gummie, Disp: , Rfl:  .  Cyanocobalamin (VITAMIN B12) 500 MCG TABS, Take 1,000 mg by mouth daily., Disp: , Rfl:  .  Dexlansoprazole (DEXILANT) 30 MG capsule, Take 30 mg by mouth daily., Disp: , Rfl:  .  fluticasone (FLONASE) 50 MCG/ACT nasal spray, Place 2 sprays into both nostrils daily as needed for allergies or rhinitis., Disp: , Rfl:  .  Magnesium 200 MG TABS, Take 200 mg by mouth at bedtime. , Disp: , Rfl:  .  Melatonin 10 MG TABS, Take 10 mg by mouth at bedtime., Disp: , Rfl:  .  SYNTHROID 112 MCG tablet, Take 112 mcg by mouth daily. , Disp: , Rfl:  .  venlafaxine XR (EFFEXOR-XR) 150 MG 24 hr capsule, Take 150 mg by mouth daily. Take with 75 mg for a total of 225 mg, Disp: , Rfl:  .  gabapentin (NEURONTIN) 400 MG capsule, Take 1 capsule (400 mg total) by mouth 3 (three) times daily., Disp: 60 capsule, Rfl: 3 .  HYDROcodone-acetaminophen (NORCO) 5-325 MG tablet, Take 1-2 tablets by mouth every 6 (six) hours as needed., Disp: 50 tablet, Rfl: 0 .  multivitamin-lutein (OCUVITE-LUTEIN) CAPS capsule, Take 1 capsule by mouth daily., Disp: , Rfl:    Objective:   Vitals:   07/07/20 1206  BP: (!) 145/88  Pulse: 86  SpO2: 97%    Physical Exam Vitals and nursing note reviewed.  Constitutional:      Appearance: Normal appearance.  HENT:     Head: Normocephalic and atraumatic.  Cardiovascular:     Rate and Rhythm: Normal rate.     Pulses: Normal pulses.  Pulmonary:     Effort: Pulmonary effort is normal. No respiratory distress.  Abdominal:     General: There is no distension.     Tenderness: There is no abdominal tenderness.  Neurological:     General: No focal deficit present.     Mental Status: She is alert and oriented to person, place, and time.  Psychiatric:        Mood and Affect: Mood normal.        Behavior: Behavior normal.        Thought Content:  Thought content normal.     Assessment & Plan:  Malignant neoplasm of right breast in female, estrogen receptor positive, unspecified site of breast ([place])  Postoperative breast asymmetry  We discussed the risks of surgery to the right breast because of the radiation.  I highly recommend that she not do any surgery on the right.  The patient  agrees.  She would like to have a left breast reduction for symmetry and I think that is very reasonable.We will submit for left breast reduction.  Pictures were obtained of the patient and placed in the chart with the patient's or guardian's permission.  We need her mammogram results and her information from [facility].  Alena Bills Garcia Dalzell, DO

## 2020-07-30 NOTE — Progress Notes (Signed)
ICD-10-CM   1. Postoperative breast asymmetry  N64.89   2. Malignant neoplasm of right breast in female, estrogen receptor positive, unspecified site of breast [facility])  C50.911    Z17.0       Patient ID: Heather Mcpherson, female    DOB: 1956/02/28, 65 y.o.   MRN: [id number]   History of Present Illness: Heather Mcpherson is a 65 y.o.  female  with a history of Hx right mastectomy and radiation.  She presents for preoperative evaluation for upcoming procedure, left breast reduction, scheduled for 08/24/2020 with Dr. Marla Roe.  Summary from previous visit: Patient had 2 partial mastectomies of the right breast at [facility] in 2009.  She was radiated ending in 2011.  She is 5 feet 8 inches tall and weighs 269 pounds.  She recalls during the partial mastectomy she had fluid retention and therefore prefers a drain to be used for surgery.  She has extreme asymmetry of her breasts.  IMF distance is 32 cm on the right and 40 cm on the left.  Asymmetry is causing difficulty finding proper fitting shirts and bras as well as strain on her neck and back.  Estimated excess breast tissue to be removed at time of surgery is 500 g from the left breast.  Patient reports significant swelling in her right breast after her lumpectomies.  Job: Former Tour manager  PMH Significant for: Hx right breast cancer with chemo and radiation, GERD, hypothyroid, sleep apnea with CPAP  The patient has had problems with anesthesia. PONV - Would be interested in trying the scop patch  Past Medical History: Allergies: Allergies  Allergen Reactions  . Hydrochlorothiazide Itching and Rash    Current Medications:  Current Outpatient Medications:  .  atorvastatin (LIPITOR) 10 MG tablet, Take 10 mg by mouth at bedtime. , Disp: , Rfl:  .  carboxymethylcellulose (REFRESH PLUS) 0.5 % SOLN, Place 1 drop into both eyes 2 (two) times daily as needed (Dry eyes)., Disp: , Rfl:  .  celecoxib (CELEBREX) 200 MG capsule, Take 200 mg by  mouth daily. , Disp: , Rfl:  .  Cholecalciferol (VITAMIN D) 50 MCG (2000 UT) tablet, Take 2,000 Units by mouth 2 (two) times daily. Gummie, Disp: , Rfl:  .  Cyanocobalamin (VITAMIN B12) 500 MCG TABS, Take 1,000 mg by mouth daily., Disp: , Rfl:  .  Dexlansoprazole (DEXILANT) 30 MG capsule, Take 30 mg by mouth daily., Disp: , Rfl:  .  fluticasone (FLONASE) 50 MCG/ACT nasal spray, Place 2 sprays into both nostrils daily as needed for allergies or rhinitis., Disp: , Rfl:  .  gabapentin (NEURONTIN) 400 MG capsule, Take 1 capsule (400 mg total) by mouth 3 (three) times daily., Disp: 60 capsule, Rfl: 3 .  loratadine (CLARITIN) 10 MG tablet, Take 10 mg by mouth daily., Disp: , Rfl:  .  Magnesium 200 MG TABS, Take 200 mg by mouth at bedtime. , Disp: , Rfl:  .  Melatonin 10 MG TABS, Take 10 mg by mouth at bedtime., Disp: , Rfl:  .  montelukast (SINGULAIR) 10 MG tablet, Take 10 mg by mouth daily., Disp: , Rfl:  .  multivitamin-lutein (OCUVITE-LUTEIN) CAPS capsule, Take 1 capsule by mouth daily., Disp: , Rfl:  .  SYNTHROID 112 MCG tablet, Take 112 mcg by mouth daily. , Disp: , Rfl:  .  venlafaxine XR (EFFEXOR-XR) 150 MG 24 hr capsule, Take 150 mg by mouth daily. Take with 75 mg for a total of 225 mg,  Disp: , Rfl:   Past Medical Problems: Past Medical History:  Diagnosis Date  . Ankle edema   . Anxiety   . Breast cancer [facility]) 2009   right breast, chemo and radiation  . Cancer ([place])    breast  . Chronic kidney disease    UTI  . Depression   . GERD (gastroesophageal reflux disease)   . Hypercholesteremia   . Hypothyroidism   . Personal history of chemotherapy   . Personal history of radiation therapy   . PONV (postoperative nausea and vomiting)    many years ago  . Seasonal allergies   . Sleep apnea    CPAP  . Thyroid disease     Past Surgical History: Past Surgical History:  Procedure Laterality Date  . ABDOMINAL HYSTERECTOMY    . BREAST BIOPSY Right 2011   negative, stereo  . BREAST  BIOPSY Right 2014   negative, stereo  . BREAST EXCISIONAL BIOPSY Right 2009   positive  . BREAST LUMPECTOMY Right 2009  . BREAST SURGERY Right Oct 23, 2007   Lumpectomy  . COLONOSCOPY WITH PROPOFOL N/A 01/19/2015   Procedure: COLONOSCOPY WITH PROPOFOL;  Surgeon: Josefine Class, MD;  Location: [facility] ENDOSCOPY;  Service: Endoscopy;  Laterality: N/A;  . evacuation of hematoma right breast    . EYE SURGERY Bilateral    cataracts  . HAMMERTOE RECONSTRUCTION WITH WEIL OSTEOTOMY Right 04/26/2019   Procedure: release hammertoe right second toe;  Surgeon: Earnestine Leys, MD;  Location: [facility] ORS;  Service: Orthopedics;  Laterality: Right;  . HEEL SPUR EXCISION Bilateral   . JOINT REPLACEMENT Right July 14, 2013   Right Knee Replacement  . RETINAL DETACHMENT SURGERY Left September 2007  . surgery on heel, foot elbow    . TOTAL KNEE ARTHROPLASTY Left 04/19/2015   Procedure: TOTAL KNEE ARTHROPLASTY;  Surgeon: Earnestine Leys, MD;  Location: [facility] ORS;  Service: Orthopedics;  Laterality: Left;    Social History: Social History   Socioeconomic History  . Marital status: Significant Other    Spouse name: Not on file  . Number of children: Not on file  . Years of education: Not on file  . Highest education level: Not on file  Occupational History  . Not on file  Tobacco Use  . Smoking status: Passive Smoke Exposure - Never Smoker  . Smokeless tobacco: Never Used  Vaping Use  . Vaping Use: Never used  Substance and Sexual Activity  . Alcohol use: No    Alcohol/week: 0.0 standard drinks  . Drug use: No  . Sexual activity: Not on file  Other Topics Concern  . Not on file  Social History Narrative  . Not on file   Social Determinants of Health   Financial Resource Strain: Not on file  Food Insecurity: Not on file  Transportation Needs: Not on file  Physical Activity: Not on file  Stress: Not on file  Social Connections: Not on file  Intimate Partner Violence: Not on file     Family History: Family History  Problem Relation Age of Onset  . Breast cancer Neg Hx     Review of Systems: Review of Systems  Constitutional: Negative for chills and fever.  HENT: Negative for congestion and sore throat.   Respiratory: Negative for cough and shortness of breath.   Cardiovascular: Negative for chest pain and palpitations.  Gastrointestinal: Negative for abdominal pain, nausea and vomiting.  Musculoskeletal: Positive for back pain, joint pain and neck pain.  Skin: Negative for itching  and rash.    Physical Exam: Vital Signs BP 123/74 (BP Location: Left Arm, Patient Position: Sitting, Cuff Size: Large)   Pulse (!) 114   Ht 5\' 8"  (1.727 m)   Wt 266 lb 6.4 oz (120.8 kg)   SpO2 98%   BMI 40.51 kg/m  Physical Exam Vitals and nursing note reviewed.  Constitutional:      General: She is not in acute distress.    Appearance: Normal appearance. She is obese. She is not ill-appearing.  HENT:     Head: Normocephalic and atraumatic.  Eyes:     Extraocular Movements: Extraocular movements intact.  Cardiovascular:     Rate and Rhythm: Normal rate and regular rhythm.     Pulses: Normal pulses.     Heart sounds: Normal heart sounds.  Pulmonary:     Effort: Pulmonary effort is normal.     Breath sounds: Normal breath sounds. No wheezing, rhonchi or rales.  Abdominal:     General: Bowel sounds are normal.     Palpations: Abdomen is soft.  Musculoskeletal:        General: No swelling. Normal range of motion.     Cervical back: Normal range of motion.  Skin:    General: Skin is warm and dry.     Coloration: Skin is not pale.     Findings: No erythema or rash.  Neurological:     General: No focal deficit present.     Mental Status: She is alert and oriented to person, place, and time.  Psychiatric:        Mood and Affect: Mood normal.        Behavior: Behavior normal.        Thought Content: Thought content normal.        Judgment: Judgment normal.      Assessment/Plan:  Ms. Kozinski scheduled for left breast reduction with Dr. Marla Roe.  Risks, benefits, and alternatives of procedure discussed, questions answered and consent obtained.    Smoking Status: non-smoker ; Counseling Given? N/A Last Mammogram: 05/23/2020; Results: No findings suspicious for malignancy  Caprini Score: High; Risk Factors include: 65 year old female, Hx breast cancer, family hx (brother) of blood clots, BMI > 25, and length of planned surgery. Recommendation for mechanical and pharmacological prophylaxis during surgery. Encourage early ambulation.   Pictures obtained: 07/07/2020  Post-op Rx sent to pharmacy: Norco, Zofran, Keflex Patient currently takes celebrex  Patient was provided with the breast reduction risks and General Surgical Risk consent document and Pain Medication Agreement prior to their appointment.  They had adequate time to read through the risk consent documents and Pain Medication Agreement. We also discussed them in person together during this preop appointment. All of their questions were answered to their satisfaction.  Recommended calling if they have any further questions.  Risk consent form and Pain Medication Agreement to be scanned into patient's chart.  The risk that can be encountered with breast reduction were discussed and include the following but not limited to these:  Breast asymmetry, fluid accumulation, firmness of the breast, inability to breast feed, loss of nipple or areola, skin loss, decrease or no nipple sensation, fat necrosis of the breast tissue, bleeding, infection, healing delay.  There are risks of anesthesia, changes to skin sensation and injury to nerves or blood vessels.  The muscle can be temporarily or permanently injured.  You may have an allergic reaction to tape, suture, glue, blood products which can result in skin discoloration, swelling, pain, skin lesions,  poor healing.  Any of these can lead to the need for  revisonal surgery or stage procedures.  A reduction has potential to interfere with diagnostic procedures.  Nipple or breast piercing can increase risks of infection.  This procedure is best done when the breast is fully developed.  Changes in the breast will continue to occur over time.  Pregnancy can alter the outcomes of previous breast reduction surgery, weight gain and weigh loss can also effect the long term appearance.   Electronically signed by: Threasa Heads, PA-C 08/01/2020 9:41 AM

## 2020-07-30 NOTE — H&P (View-Only) (Signed)
ICD-10-CM   1. Postoperative breast asymmetry  N64.89   2. Malignant neoplasm of right breast in female, estrogen receptor positive, unspecified site of breast [facility])  C50.911    Z17.0       Patient ID: Heather Mcpherson, female    DOB: 1956-03-09, 65 y.o.   MRN: [id number]   History of Present Illness: Heather Mcpherson is a 65 y.o.  female  with a history of Hx right mastectomy and radiation.  She presents for preoperative evaluation for upcoming procedure, left breast reduction, scheduled for 08/24/2020 with Dr. Marla Roe.  Summary from previous visit: Patient had 2 partial mastectomies of the right breast at [facility] in 2009.  She was radiated ending in 2011.  She is 5 feet 8 inches tall and weighs 269 pounds.  She recalls during the partial mastectomy she had fluid retention and therefore prefers a drain to be used for surgery.  She has extreme asymmetry of her breasts.  IMF distance is 32 cm on the right and 40 cm on the left.  Asymmetry is causing difficulty finding proper fitting shirts and bras as well as strain on her neck and back.  Estimated excess breast tissue to be removed at time of surgery is 500 g from the left breast.  Patient reports significant swelling in her right breast after her lumpectomies.  Job: Former Tour manager  PMH Significant for: Hx right breast cancer with chemo and radiation, GERD, hypothyroid, sleep apnea with CPAP  The patient has had problems with anesthesia. PONV - Would be interested in trying the scop patch  Past Medical History: Allergies: Allergies  Allergen Reactions  . Hydrochlorothiazide Itching and Rash    Current Medications:  Current Outpatient Medications:  .  atorvastatin (LIPITOR) 10 MG tablet, Take 10 mg by mouth at bedtime. , Disp: , Rfl:  .  carboxymethylcellulose (REFRESH PLUS) 0.5 % SOLN, Place 1 drop into both eyes 2 (two) times daily as needed (Dry eyes)., Disp: , Rfl:  .  celecoxib (CELEBREX) 200 MG capsule, Take 200 mg by  mouth daily. , Disp: , Rfl:  .  Cholecalciferol (VITAMIN D) 50 MCG (2000 UT) tablet, Take 2,000 Units by mouth 2 (two) times daily. Gummie, Disp: , Rfl:  .  Cyanocobalamin (VITAMIN B12) 500 MCG TABS, Take 1,000 mg by mouth daily., Disp: , Rfl:  .  Dexlansoprazole (DEXILANT) 30 MG capsule, Take 30 mg by mouth daily., Disp: , Rfl:  .  fluticasone (FLONASE) 50 MCG/ACT nasal spray, Place 2 sprays into both nostrils daily as needed for allergies or rhinitis., Disp: , Rfl:  .  gabapentin (NEURONTIN) 400 MG capsule, Take 1 capsule (400 mg total) by mouth 3 (three) times daily., Disp: 60 capsule, Rfl: 3 .  loratadine (CLARITIN) 10 MG tablet, Take 10 mg by mouth daily., Disp: , Rfl:  .  Magnesium 200 MG TABS, Take 200 mg by mouth at bedtime. , Disp: , Rfl:  .  Melatonin 10 MG TABS, Take 10 mg by mouth at bedtime., Disp: , Rfl:  .  montelukast (SINGULAIR) 10 MG tablet, Take 10 mg by mouth daily., Disp: , Rfl:  .  multivitamin-lutein (OCUVITE-LUTEIN) CAPS capsule, Take 1 capsule by mouth daily., Disp: , Rfl:  .  SYNTHROID 112 MCG tablet, Take 112 mcg by mouth daily. , Disp: , Rfl:  .  venlafaxine XR (EFFEXOR-XR) 150 MG 24 hr capsule, Take 150 mg by mouth daily. Take with 75 mg for a total of 225 mg,  Disp: , Rfl:   Past Medical Problems: Past Medical History:  Diagnosis Date  . Ankle edema   . Anxiety   . Breast cancer [facility]) 2009   right breast, chemo and radiation  . Cancer ([place])    breast  . Chronic kidney disease    UTI  . Depression   . GERD (gastroesophageal reflux disease)   . Hypercholesteremia   . Hypothyroidism   . Personal history of chemotherapy   . Personal history of radiation therapy   . PONV (postoperative nausea and vomiting)    many years ago  . Seasonal allergies   . Sleep apnea    CPAP  . Thyroid disease     Past Surgical History: Past Surgical History:  Procedure Laterality Date  . ABDOMINAL HYSTERECTOMY    . BREAST BIOPSY Right 2011   negative, stereo  . BREAST  BIOPSY Right 2014   negative, stereo  . BREAST EXCISIONAL BIOPSY Right 2009   positive  . BREAST LUMPECTOMY Right 2009  . BREAST SURGERY Right Oct 23, 2007   Lumpectomy  . COLONOSCOPY WITH PROPOFOL N/A 01/19/2015   Procedure: COLONOSCOPY WITH PROPOFOL;  Surgeon: Josefine Class, MD;  Location: [facility] ENDOSCOPY;  Service: Endoscopy;  Laterality: N/A;  . evacuation of hematoma right breast    . EYE SURGERY Bilateral    cataracts  . HAMMERTOE RECONSTRUCTION WITH WEIL OSTEOTOMY Right 04/26/2019   Procedure: release hammertoe right second toe;  Surgeon: Earnestine Leys, MD;  Location: [facility] ORS;  Service: Orthopedics;  Laterality: Right;  . HEEL SPUR EXCISION Bilateral   . JOINT REPLACEMENT Right July 14, 2013   Right Knee Replacement  . RETINAL DETACHMENT SURGERY Left September 2007  . surgery on heel, foot elbow    . TOTAL KNEE ARTHROPLASTY Left 04/19/2015   Procedure: TOTAL KNEE ARTHROPLASTY;  Surgeon: Earnestine Leys, MD;  Location: [facility] ORS;  Service: Orthopedics;  Laterality: Left;    Social History: Social History   Socioeconomic History  . Marital status: Significant Other    Spouse name: Not on file  . Number of children: Not on file  . Years of education: Not on file  . Highest education level: Not on file  Occupational History  . Not on file  Tobacco Use  . Smoking status: Passive Smoke Exposure - Never Smoker  . Smokeless tobacco: Never Used  Vaping Use  . Vaping Use: Never used  Substance and Sexual Activity  . Alcohol use: No    Alcohol/week: 0.0 standard drinks  . Drug use: No  . Sexual activity: Not on file  Other Topics Concern  . Not on file  Social History Narrative  . Not on file   Social Determinants of Health   Financial Resource Strain: Not on file  Food Insecurity: Not on file  Transportation Needs: Not on file  Physical Activity: Not on file  Stress: Not on file  Social Connections: Not on file  Intimate Partner Violence: Not on file     Family History: Family History  Problem Relation Age of Onset  . Breast cancer Neg Hx     Review of Systems: Review of Systems  Constitutional: Negative for chills and fever.  HENT: Negative for congestion and sore throat.   Respiratory: Negative for cough and shortness of breath.   Cardiovascular: Negative for chest pain and palpitations.  Gastrointestinal: Negative for abdominal pain, nausea and vomiting.  Musculoskeletal: Positive for back pain, joint pain and neck pain.  Skin: Negative for itching  and rash.    Physical Exam: Vital Signs BP 123/74 (BP Location: Left Arm, Patient Position: Sitting, Cuff Size: Large)   Pulse (!) 114   Ht 5\' 8"  (1.727 m)   Wt 266 lb 6.4 oz (120.8 kg)   SpO2 98%   BMI 40.51 kg/m  Physical Exam Vitals and nursing note reviewed.  Constitutional:      General: She is not in acute distress.    Appearance: Normal appearance. She is obese. She is not ill-appearing.  HENT:     Head: Normocephalic and atraumatic.  Eyes:     Extraocular Movements: Extraocular movements intact.  Cardiovascular:     Rate and Rhythm: Normal rate and regular rhythm.     Pulses: Normal pulses.     Heart sounds: Normal heart sounds.  Pulmonary:     Effort: Pulmonary effort is normal.     Breath sounds: Normal breath sounds. No wheezing, rhonchi or rales.  Abdominal:     General: Bowel sounds are normal.     Palpations: Abdomen is soft.  Musculoskeletal:        General: No swelling. Normal range of motion.     Cervical back: Normal range of motion.  Skin:    General: Skin is warm and dry.     Coloration: Skin is not pale.     Findings: No erythema or rash.  Neurological:     General: No focal deficit present.     Mental Status: She is alert and oriented to person, place, and time.  Psychiatric:        Mood and Affect: Mood normal.        Behavior: Behavior normal.        Thought Content: Thought content normal.        Judgment: Judgment normal.      Assessment/Plan:  Heather Mcpherson scheduled for left breast reduction with Dr. Marla Roe.  Risks, benefits, and alternatives of procedure discussed, questions answered and consent obtained.    Smoking Status: non-smoker ; Counseling Given? N/A Last Mammogram: 05/23/2020; Results: No findings suspicious for malignancy  Caprini Score: High; Risk Factors include: 65 year old female, Hx breast cancer, family hx (brother) of blood clots, BMI > 25, and length of planned surgery. Recommendation for mechanical and pharmacological prophylaxis during surgery. Encourage early ambulation.   Pictures obtained: 07/07/2020  Post-op Rx sent to pharmacy: Norco, Zofran, Keflex Patient currently takes celebrex  Patient was provided with the breast reduction risks and General Surgical Risk consent document and Pain Medication Agreement prior to their appointment.  They had adequate time to read through the risk consent documents and Pain Medication Agreement. We also discussed them in person together during this preop appointment. All of their questions were answered to their satisfaction.  Recommended calling if they have any further questions.  Risk consent form and Pain Medication Agreement to be scanned into patient's chart.  The risk that can be encountered with breast reduction were discussed and include the following but not limited to these:  Breast asymmetry, fluid accumulation, firmness of the breast, inability to breast feed, loss of nipple or areola, skin loss, decrease or no nipple sensation, fat necrosis of the breast tissue, bleeding, infection, healing delay.  There are risks of anesthesia, changes to skin sensation and injury to nerves or blood vessels.  The muscle can be temporarily or permanently injured.  You may have an allergic reaction to tape, suture, glue, blood products which can result in skin discoloration, swelling, pain, skin lesions,  poor healing.  Any of these can lead to the need for  revisonal surgery or stage procedures.  A reduction has potential to interfere with diagnostic procedures.  Nipple or breast piercing can increase risks of infection.  This procedure is best done when the breast is fully developed.  Changes in the breast will continue to occur over time.  Pregnancy can alter the outcomes of previous breast reduction surgery, weight gain and weigh loss can also effect the long term appearance.   Electronically signed by: Threasa Heads, PA-C 08/01/2020 9:41 AM

## 2020-08-01 ENCOUNTER — Ambulatory Visit (INDEPENDENT_AMBULATORY_CARE_PROVIDER_SITE_OTHER): Payer: Medicare Other | Admitting: Plastic Surgery

## 2020-08-01 ENCOUNTER — Encounter: Payer: Self-pay | Admitting: Plastic Surgery

## 2020-08-01 ENCOUNTER — Other Ambulatory Visit: Payer: Self-pay

## 2020-08-01 VITALS — BP 123/74 | HR 114 | Ht 68.0 in | Wt 266.4 lb

## 2020-08-01 DIAGNOSIS — N6489 Other specified disorders of breast: Secondary | ICD-10-CM

## 2020-08-01 DIAGNOSIS — C50911 Malignant neoplasm of unspecified site of right female breast: Secondary | ICD-10-CM

## 2020-08-01 DIAGNOSIS — Z170 Estrogen receptor positive status [ER+]: Secondary | ICD-10-CM

## 2020-08-01 MED ORDER — HYDROCODONE-ACETAMINOPHEN 5-325 MG PO TABS: 1.0000 | ORAL_TABLET | Freq: Three times a day (TID) | ORAL | 0 refills | Status: AC | PRN

## 2020-08-01 MED ORDER — ONDANSETRON 4 MG PO TBDP: 4.0000 mg | ORAL_TABLET | Freq: Three times a day (TID) | ORAL | 0 refills | Status: DC | PRN

## 2020-08-01 MED ORDER — CEPHALEXIN 500 MG PO CAPS: 500.0000 mg | ORAL_CAPSULE | Freq: Four times a day (QID) | ORAL | 0 refills | Status: AC

## 2020-08-17 ENCOUNTER — Encounter (HOSPITAL_BASED_OUTPATIENT_CLINIC_OR_DEPARTMENT_OTHER): Payer: Self-pay | Admitting: Plastic Surgery

## 2020-08-17 ENCOUNTER — Other Ambulatory Visit: Payer: Self-pay

## 2020-08-21 ENCOUNTER — Other Ambulatory Visit
Admission: RE | Admit: 2020-08-21 | Discharge: 2020-08-21 | Disposition: A | Discharge status: Home / Self Care | Payer: Medicare Other | Source: Ambulatory Visit | Attending: Plastic Surgery | Admitting: Plastic Surgery

## 2020-08-21 ENCOUNTER — Other Ambulatory Visit: Payer: Self-pay

## 2020-08-21 ENCOUNTER — Other Ambulatory Visit (HOSPITAL_COMMUNITY): Payer: Medicare Other

## 2020-08-21 DIAGNOSIS — Z20822 Contact with and (suspected) exposure to covid-19: Secondary | ICD-10-CM | POA: Insufficient documentation

## 2020-08-21 DIAGNOSIS — Z01812 Encounter for preprocedural laboratory examination: Secondary | ICD-10-CM | POA: Insufficient documentation

## 2020-08-21 LAB — SARS CORONAVIRUS 2 (TAT 6-24 HRS): SARS Coronavirus 2: NEGATIVE

## 2020-08-23 NOTE — Anesthesia Preprocedure Evaluation (Addendum)
Anesthesia Evaluation  Patient identified by MRN, date of birth, ID band Patient awake    Reviewed: Allergy & Precautions, NPO status , Patient's Chart, lab work & pertinent test results  History of Anesthesia Complications (+) PONV and history of anesthetic complications  Airway Mallampati: III  TM Distance: <3 FB Neck ROM: Full    Dental  (+) Missing,    Pulmonary sleep apnea and Continuous Positive Airway Pressure Ventilation ,    Pulmonary exam normal        Cardiovascular negative cardio ROS Normal cardiovascular exam     Neuro/Psych Anxiety Depression negative neurological ROS     GI/Hepatic Neg liver ROS, GERD  Controlled and Medicated,  Endo/Other  Hypothyroidism Morbid obesity  Renal/GU negative Renal ROS  negative genitourinary   Musculoskeletal negative musculoskeletal ROS (+)   Abdominal   Peds  Hematology negative hematology ROS (+)   Anesthesia Other Findings Hx of right breast ca  Reproductive/Obstetrics negative OB ROS                           Anesthesia Physical Anesthesia Plan  ASA: III  Anesthesia Plan: General   Post-op Pain Management:    Induction: Intravenous  PONV Risk Score and Plan: 4 or greater and Treatment may vary due to age or medical condition, Ondansetron, Dexamethasone and Midazolam  Airway Management Planned: Oral ETT  Additional Equipment: None  Intra-op Plan:   Post-operative Plan: Extubation in OR  Informed Consent: I have reviewed the patients History and Physical, chart, labs and discussed the procedure including the risks, benefits and alternatives for the proposed anesthesia with the patient or authorized representative who has indicated his/her understanding and acceptance.     Dental advisory given  Plan Discussed with: CRNA  Anesthesia Plan Comments:        Anesthesia Quick Evaluation

## 2020-08-24 ENCOUNTER — Ambulatory Visit (HOSPITAL_BASED_OUTPATIENT_CLINIC_OR_DEPARTMENT_OTHER): Payer: Medicare Other | Admitting: Anesthesiology

## 2020-08-24 ENCOUNTER — Encounter (HOSPITAL_BASED_OUTPATIENT_CLINIC_OR_DEPARTMENT_OTHER): Payer: Self-pay | Admitting: Plastic Surgery

## 2020-08-24 ENCOUNTER — Other Ambulatory Visit: Payer: Self-pay

## 2020-08-24 ENCOUNTER — Encounter (HOSPITAL_BASED_OUTPATIENT_CLINIC_OR_DEPARTMENT_OTHER)
Admission: RE | Disposition: A | Discharge status: Home / Self Care | Payer: Self-pay | Source: Ambulatory Visit | Attending: Plastic Surgery

## 2020-08-24 ENCOUNTER — Ambulatory Visit (HOSPITAL_BASED_OUTPATIENT_CLINIC_OR_DEPARTMENT_OTHER)
Admission: RE | Admit: 2020-08-24 | Discharge: 2020-08-24 | Disposition: A | Discharge status: Home / Self Care | Payer: Medicare Other | Source: Ambulatory Visit | Attending: Plastic Surgery | Admitting: Plastic Surgery

## 2020-08-24 DIAGNOSIS — Z79890 Hormone replacement therapy (postmenopausal): Secondary | ICD-10-CM | POA: Diagnosis not present

## 2020-08-24 DIAGNOSIS — M546 Pain in thoracic spine: Secondary | ICD-10-CM | POA: Diagnosis not present

## 2020-08-24 DIAGNOSIS — N62 Hypertrophy of breast: Secondary | ICD-10-CM | POA: Diagnosis not present

## 2020-08-24 DIAGNOSIS — Z9011 Acquired absence of right breast and nipple: Secondary | ICD-10-CM | POA: Insufficient documentation

## 2020-08-24 DIAGNOSIS — N640 Fissure and fistula of nipple: Secondary | ICD-10-CM | POA: Diagnosis not present

## 2020-08-24 DIAGNOSIS — M549 Dorsalgia, unspecified: Secondary | ICD-10-CM

## 2020-08-24 DIAGNOSIS — Z923 Personal history of irradiation: Secondary | ICD-10-CM | POA: Diagnosis not present

## 2020-08-24 DIAGNOSIS — M542 Cervicalgia: Secondary | ICD-10-CM

## 2020-08-24 DIAGNOSIS — M954 Acquired deformity of chest and rib: Secondary | ICD-10-CM | POA: Diagnosis not present

## 2020-08-24 DIAGNOSIS — N6489 Other specified disorders of breast: Secondary | ICD-10-CM | POA: Insufficient documentation

## 2020-08-24 DIAGNOSIS — Z853 Personal history of malignant neoplasm of breast: Secondary | ICD-10-CM | POA: Insufficient documentation

## 2020-08-24 DIAGNOSIS — Z888 Allergy status to other drugs, medicaments and biological substances status: Secondary | ICD-10-CM | POA: Insufficient documentation

## 2020-08-24 DIAGNOSIS — M5489 Other dorsalgia: Secondary | ICD-10-CM | POA: Diagnosis not present

## 2020-08-24 HISTORY — PX: BREAST REDUCTION SURGERY: SHX8

## 2020-08-24 SURGERY — MAMMOPLASTY, REDUCTION
Anesthesia: General | Site: Breast | Laterality: Left

## 2020-08-24 MED ORDER — OXYCODONE HCL 5 MG PO TABS
ORAL_TABLET | ORAL | Status: AC
  Filled 2020-08-24: qty 1

## 2020-08-24 MED ORDER — LACTATED RINGERS IV SOLN: INTRAVENOUS | Status: DC

## 2020-08-24 MED ORDER — CEFAZOLIN SODIUM-DEXTROSE 2-4 GM/100ML-% IV SOLN
INTRAVENOUS | Status: AC
  Filled 2020-08-24: qty 100

## 2020-08-24 MED ORDER — EPHEDRINE SULFATE 50 MG/ML IJ SOLN
INTRAMUSCULAR | Status: DC | PRN
  Administered 2020-08-24: 10 mg via INTRAVENOUS

## 2020-08-24 MED ORDER — FENTANYL CITRATE (PF) 100 MCG/2ML IJ SOLN: 25.0000 ug | INTRAMUSCULAR | Status: DC | PRN

## 2020-08-24 MED ORDER — DEXTROSE 5 % IV SOLN
3.0000 g | INTRAVENOUS | Status: AC
  Administered 2020-08-24: 3 g via INTRAVENOUS
  Filled 2020-08-24: qty 3000

## 2020-08-24 MED ORDER — ONDANSETRON HCL 4 MG/2ML IJ SOLN
INTRAMUSCULAR | Status: DC | PRN
  Administered 2020-08-24: 4 mg via INTRAVENOUS

## 2020-08-24 MED ORDER — ROCURONIUM BROMIDE 100 MG/10ML IV SOLN
INTRAVENOUS | Status: DC | PRN
  Administered 2020-08-24: 70 mg via INTRAVENOUS

## 2020-08-24 MED ORDER — FENTANYL CITRATE (PF) 100 MCG/2ML IJ SOLN
INTRAMUSCULAR | Status: DC | PRN
  Administered 2020-08-24: 100 ug via INTRAVENOUS
  Administered 2020-08-24 (×2): 25 ug via INTRAVENOUS
  Administered 2020-08-24: 50 ug via INTRAVENOUS

## 2020-08-24 MED ORDER — BUPIVACAINE-EPINEPHRINE (PF) 0.25% -1:200000 IJ SOLN
INTRAMUSCULAR | Status: AC
  Filled 2020-08-24: qty 30

## 2020-08-24 MED ORDER — EPINEPHRINE PF 1 MG/ML IJ SOLN
INTRAMUSCULAR | Status: AC
  Filled 2020-08-24: qty 1

## 2020-08-24 MED ORDER — ACETAMINOPHEN 325 MG PO TABS: 650.0000 mg | ORAL_TABLET | ORAL | Status: DC | PRN

## 2020-08-24 MED ORDER — CHLORHEXIDINE GLUCONATE CLOTH 2 % EX PADS: 6.0000 | MEDICATED_PAD | Freq: Once | CUTANEOUS | Status: DC

## 2020-08-24 MED ORDER — OXYCODONE HCL 5 MG PO TABS
5.0000 mg | ORAL_TABLET | Freq: Once | ORAL | Status: AC | PRN
  Administered 2020-08-24: 5 mg via ORAL

## 2020-08-24 MED ORDER — ACETAMINOPHEN 500 MG PO TABS
1000.0000 mg | ORAL_TABLET | Freq: Once | ORAL | Status: AC
  Administered 2020-08-24: 1000 mg via ORAL

## 2020-08-24 MED ORDER — SODIUM CHLORIDE 0.9 % IV SOLN: 250.0000 mL | INTRAVENOUS | Status: DC | PRN

## 2020-08-24 MED ORDER — EPHEDRINE 5 MG/ML INJ
INTRAVENOUS | Status: AC
  Filled 2020-08-24: qty 10

## 2020-08-24 MED ORDER — PHENYLEPHRINE 40 MCG/ML (10ML) SYRINGE FOR IV PUSH (FOR BLOOD PRESSURE SUPPORT)
PREFILLED_SYRINGE | INTRAVENOUS | Status: AC
  Filled 2020-08-24: qty 10

## 2020-08-24 MED ORDER — LIDOCAINE-EPINEPHRINE 1 %-1:100000 IJ SOLN
INTRAMUSCULAR | Status: AC
  Filled 2020-08-24: qty 1

## 2020-08-24 MED ORDER — MIDAZOLAM HCL 5 MG/5ML IJ SOLN
INTRAMUSCULAR | Status: DC | PRN
  Administered 2020-08-24: 2 mg via INTRAVENOUS

## 2020-08-24 MED ORDER — PROPOFOL 500 MG/50ML IV EMUL
INTRAVENOUS | Status: AC
  Filled 2020-08-24: qty 50

## 2020-08-24 MED ORDER — SUCCINYLCHOLINE CHLORIDE 200 MG/10ML IV SOSY
PREFILLED_SYRINGE | INTRAVENOUS | Status: AC
  Filled 2020-08-24: qty 10

## 2020-08-24 MED ORDER — LIDOCAINE HCL (CARDIAC) PF 100 MG/5ML IV SOSY
PREFILLED_SYRINGE | INTRAVENOUS | Status: DC | PRN
  Administered 2020-08-24: 100 mg via INTRAVENOUS

## 2020-08-24 MED ORDER — CEFAZOLIN SODIUM-DEXTROSE 1-4 GM/50ML-% IV SOLN
INTRAVENOUS | Status: AC
  Filled 2020-08-24: qty 50

## 2020-08-24 MED ORDER — OXYCODONE HCL 5 MG PO TABS: 5.0000 mg | ORAL_TABLET | ORAL | Status: DC | PRN

## 2020-08-24 MED ORDER — LIDOCAINE HCL (PF) 1 % IJ SOLN
INTRAMUSCULAR | Status: AC
  Filled 2020-08-24: qty 30

## 2020-08-24 MED ORDER — LIDOCAINE 2% (20 MG/ML) 5 ML SYRINGE
INTRAMUSCULAR | Status: AC
  Filled 2020-08-24: qty 5

## 2020-08-24 MED ORDER — PROMETHAZINE HCL 25 MG/ML IJ SOLN: 6.2500 mg | INTRAMUSCULAR | Status: DC | PRN

## 2020-08-24 MED ORDER — MIDAZOLAM HCL 2 MG/2ML IJ SOLN
INTRAMUSCULAR | Status: AC
  Filled 2020-08-24: qty 2

## 2020-08-24 MED ORDER — BUPIVACAINE-EPINEPHRINE 0.25% -1:200000 IJ SOLN
INTRAMUSCULAR | Status: DC | PRN
  Administered 2020-08-24: 15 mL

## 2020-08-24 MED ORDER — OXYCODONE HCL 5 MG/5ML PO SOLN: 5.0000 mg | Freq: Once | ORAL | Status: AC | PRN

## 2020-08-24 MED ORDER — DEXAMETHASONE SODIUM PHOSPHATE 4 MG/ML IJ SOLN
INTRAMUSCULAR | Status: DC | PRN
  Administered 2020-08-24: 10 mg via INTRAVENOUS

## 2020-08-24 MED ORDER — ACETAMINOPHEN 500 MG PO TABS
ORAL_TABLET | ORAL | Status: AC
  Filled 2020-08-24: qty 2

## 2020-08-24 MED ORDER — METHYLENE BLUE 0.5 % INJ SOLN
INTRAVENOUS | Status: AC
  Filled 2020-08-24: qty 10

## 2020-08-24 MED ORDER — SUGAMMADEX SODIUM 200 MG/2ML IV SOLN
INTRAVENOUS | Status: DC | PRN
  Administered 2020-08-24: 250 mg via INTRAVENOUS

## 2020-08-24 MED ORDER — LIDOCAINE HCL 1 % IJ SOLN
INTRAMUSCULAR | Status: DC | PRN
  Administered 2020-08-24: 15 mL

## 2020-08-24 MED ORDER — ACETAMINOPHEN 325 MG RE SUPP: 650.0000 mg | RECTAL | Status: DC | PRN

## 2020-08-24 MED ORDER — FENTANYL CITRATE (PF) 100 MCG/2ML IJ SOLN
INTRAMUSCULAR | Status: AC
  Filled 2020-08-24: qty 2

## 2020-08-24 MED ORDER — SUGAMMADEX SODIUM 200 MG/2ML IV SOLN: INTRAVENOUS | Status: DC | PRN

## 2020-08-24 MED ORDER — SODIUM CHLORIDE 0.9% FLUSH: 3.0000 mL | INTRAVENOUS | Status: DC | PRN

## 2020-08-24 MED ORDER — SODIUM CHLORIDE 0.9% FLUSH: 3.0000 mL | Freq: Two times a day (BID) | INTRAVENOUS | Status: DC

## 2020-08-24 MED ORDER — PROPOFOL 10 MG/ML IV BOLUS
INTRAVENOUS | Status: DC | PRN
  Administered 2020-08-24: 200 mg via INTRAVENOUS

## 2020-08-24 SURGICAL SUPPLY — 68 items
ADH SKN CLS APL DERMABOND .7 (GAUZE/BANDAGES/DRESSINGS) ×2
BAG DECANTER FOR FLEXI CONT (MISCELLANEOUS) IMPLANT
BINDER BREAST LRG (GAUZE/BANDAGES/DRESSINGS) IMPLANT
BINDER BREAST MEDIUM (GAUZE/BANDAGES/DRESSINGS) IMPLANT
BINDER BREAST XLRG (GAUZE/BANDAGES/DRESSINGS) ×2 IMPLANT
BINDER BREAST XXLRG (GAUZE/BANDAGES/DRESSINGS) IMPLANT
BIOPATCH RED 1 DISK 7.0 (GAUZE/BANDAGES/DRESSINGS) ×2 IMPLANT
BLADE HEX COATED 2.75 (ELECTRODE) ×2 IMPLANT
BLADE KNIFE PERSONA 10 (BLADE) ×4 IMPLANT
BLADE SURG 15 STRL LF DISP TIS (BLADE) ×1 IMPLANT
BLADE SURG 15 STRL SS (BLADE) ×2
CANISTER SUCT 1200ML W/VALVE (MISCELLANEOUS) ×2 IMPLANT
COVER BACK TABLE 60X90IN (DRAPES) ×2 IMPLANT
COVER MAYO STAND STRL (DRAPES) ×2 IMPLANT
COVER WAND RF STERILE (DRAPES) IMPLANT
DECANTER SPIKE VIAL GLASS SM (MISCELLANEOUS) ×2 IMPLANT
DERMABOND ADVANCED (GAUZE/BANDAGES/DRESSINGS) ×2
DERMABOND ADVANCED .7 DNX12 (GAUZE/BANDAGES/DRESSINGS) ×2 IMPLANT
DRAIN CHANNEL 19F RND (DRAIN) IMPLANT
DRAPE LAPAROSCOPIC ABDOMINAL (DRAPES) ×2 IMPLANT
DRSG OPSITE POSTOP 4X12 (GAUZE/BANDAGES/DRESSINGS) ×2 IMPLANT
DRSG OPSITE POSTOP 4X6 (GAUZE/BANDAGES/DRESSINGS) IMPLANT
DRSG PAD ABDOMINAL 8X10 ST (GAUZE/BANDAGES/DRESSINGS) ×2 IMPLANT
ELECT BLADE 4.0 EZ CLEAN MEGAD (MISCELLANEOUS) ×2
ELECT REM PT RETURN 9FT ADLT (ELECTROSURGICAL) ×2
ELECTRODE BLDE 4.0 EZ CLN MEGD (MISCELLANEOUS) ×1 IMPLANT
ELECTRODE REM PT RTRN 9FT ADLT (ELECTROSURGICAL) ×1 IMPLANT
EVACUATOR SILICONE 100CC (DRAIN) IMPLANT
GAUZE SPONGE 4X4 12PLY STRL LF (GAUZE/BANDAGES/DRESSINGS) IMPLANT
GLOVE SURG ENC MOIS LTX SZ6.5 (GLOVE) ×6 IMPLANT
GLOVE SURG ENC TEXT LTX SZ7.5 (GLOVE) ×2 IMPLANT
GOWN STRL REUS W/ TWL LRG LVL3 (GOWN DISPOSABLE) ×4 IMPLANT
GOWN STRL REUS W/TWL LRG LVL3 (GOWN DISPOSABLE) ×8
NDL SAFETY ECLIPSE 18X1.5 (NEEDLE) IMPLANT
NEEDLE FILTER BLUNT 18X 1/2SAF (NEEDLE)
NEEDLE FILTER BLUNT 18X1 1/2 (NEEDLE) IMPLANT
NEEDLE HYPO 18GX1.5 SHARP (NEEDLE)
NEEDLE HYPO 25X1 1.5 SAFETY (NEEDLE) ×2 IMPLANT
NEEDLE SPNL 18GX3.5 QUINCKE PK (NEEDLE) ×2 IMPLANT
NS IRRIG 1000ML POUR BTL (IV SOLUTION) ×2 IMPLANT
PACK BASIN DAY SURGERY FS (CUSTOM PROCEDURE TRAY) ×2 IMPLANT
PAD ALCOHOL SWAB (MISCELLANEOUS) IMPLANT
PAD FOAM SILICONE BACKED (GAUZE/BANDAGES/DRESSINGS) IMPLANT
PENCIL SMOKE EVACUATOR (MISCELLANEOUS) ×2 IMPLANT
PIN SAFETY STERILE (MISCELLANEOUS) IMPLANT
SLEEVE SCD COMPRESS KNEE MED (STOCKING) ×2 IMPLANT
SPONGE LAP 18X18 RF (DISPOSABLE) ×4 IMPLANT
STRIP SUTURE WOUND CLOSURE 1/2 (MISCELLANEOUS) ×4 IMPLANT
SUT MNCRL AB 4-0 PS2 18 (SUTURE) ×12 IMPLANT
SUT MON AB 3-0 SH 27 (SUTURE) ×8
SUT MON AB 3-0 SH27 (SUTURE) ×4 IMPLANT
SUT MON AB 5-0 PS2 18 (SUTURE) ×2 IMPLANT
SUT PDS 3-0 CT2 (SUTURE) ×4
SUT PDS AB 2-0 CT2 27 (SUTURE) IMPLANT
SUT PDS II 3-0 CT2 27 ABS (SUTURE) ×2 IMPLANT
SUT SILK 3 0 PS 1 (SUTURE) ×2 IMPLANT
SYR 3ML 23GX1 SAFETY (SYRINGE) IMPLANT
SYR 50ML LL SCALE MARK (SYRINGE) IMPLANT
SYR BULB IRRIG 60ML STRL (SYRINGE) ×2 IMPLANT
SYR CONTROL 10ML LL (SYRINGE) ×2 IMPLANT
TAPE MEASURE VINYL STERILE (MISCELLANEOUS) IMPLANT
TOWEL GREEN STERILE FF (TOWEL DISPOSABLE) ×4 IMPLANT
TRAY DSU PREP LF (CUSTOM PROCEDURE TRAY) ×2 IMPLANT
TUBE CONNECTING 20X1/4 (TUBING) ×2 IMPLANT
TUBING INFILTRATION IT-10001 (TUBING) IMPLANT
TUBING SET GRADUATE ASPIR 12FT (MISCELLANEOUS) IMPLANT
UNDERPAD 30X36 HEAVY ABSORB (UNDERPADS AND DIAPERS) ×2 IMPLANT
YANKAUER SUCT BULB TIP NO VENT (SUCTIONS) ×2 IMPLANT

## 2020-08-24 NOTE — Discharge Instructions (Addendum)
INSTRUCTIONS FOR AFTER SURGERY   You will likely have some questions about what to expect following your operation.  The following information will help you and your family understand what to expect when you are discharged from the hospital.  Following these guidelines will help ensure a smooth recovery and reduce risks of complications.  Postoperative instructions include information on: diet, wound care, medications and physical activity.  AFTER SURGERY Expect to go home after the procedure.  In some cases, you may need to spend one night in the hospital for observation.  DIET This surgery does not require a specific diet.  However, I have to mention that the healthier you eat the better your body can start healing. It is important to increasing your protein intake.  This means limiting the foods with added sugar.  Focus on fruits and vegetables and some meat. It is very important to drink water after your surgery.  If your urine is bright yellow, then it is concentrated, and you need to drink more water.  As a general rule after surgery, you should have 8 ounces of water every hour while awake.  If you find you are persistently nauseated or unable to take in liquids let us know.  NO TOBACCO USE or EXPOSURE.  This will slow your healing process and increase the risk of a wound.  WOUND CARE If you have a drain: Clean with baby wipes for 3-5 days and then you can shower.  If you have a binder you may remove it to shower and then put it back on. If you have steri-strips / tape directly attached to your skin leave them in place. It is OK to get these wet.  No baths, pools or hot tubs for two weeks. We close your incision to leave the smallest and best-looking scar. No ointment or creams on your incisions until given the go ahead.  Especially not Neosporin (Too many skin reactions with this one).  A few weeks after surgery you can use Mederma and start massaging the scar. We ask you to wear your binder or  sports bra for the first 6 weeks around the clock, including while sleeping. This provides added comfort and helps reduce the fluid accumulation at the surgery site.  ACTIVITY No heavy lifting until cleared by the doctor.  It is OK to walk and climb stairs. In fact, moving your legs is very important to decrease your risk of a blood clot.  It will also help keep you from getting deconditioned.  Every 1 to 2 hours get up and walk for 5 minutes. This will help with a quicker recovery back to normal.  Let pain be your guide so you don't do too much.  NO, you cannot do the spring cleaning and don't plan on taking care of anyone else.  This is your time for TLC.   WORK Everyone returns to work at different times. As a rough guide, most people take at least 1 - 2 weeks off prior to returning to work. If you need documentation for your job, bring the forms to your postoperative follow up visit.  DRIVING Arrange for someone to bring you home from the hospital.  You may be able to drive a few days after surgery but not while taking any narcotics or valium.  BOWEL MOVEMENTS Constipation can occur after anesthesia and while taking pain medication.  It is important to stay ahead for your comfort.  We recommend taking Milk of Magnesia (2 tablespoons; twice   a day) while taking the pain pills.  SEROMA This is fluid your body tried to put in the surgical site.  This is normal but if it creates excessive pain and swelling let us know.  It usually decreases in a few weeks.  MEDICATIONS and PAIN CONTROL At your preoperative visit for you history and physical you were given the following medications: 1. An antibiotic: Start this medication when you get home and take according to the instructions on the bottle. 2. Zofran 4 mg:  This is to treat nausea and vomiting.  You can take this every 6 hours as needed and only if needed. 3. Norco (hydrocodone/acetaminophen) 5/325 mg:  This is only to be used after you have  taken the motrin or the tylenol. Every 8 hours as needed. Over the counter Medication to take: 4. Ibuprofen (Motrin) 600 mg:  Take this every 6 hours.  If you have additional pain then take 500 mg of the tylenol.  Only take the Norco after you have tried these two. 5. Miralax or stool softener of choice: Take this according to the bottle if you take the [place] Call your surgeon's office if any of the following occur: . Fever 101 degrees F or greater . Excessive bleeding or fluid from the incision site. . Pain that increases over time without aid from the medications . Redness, warmth, or pus draining from incision sites . Persistent nausea or inability to take in liquids . Severe misshapen area that underwent the operation.  [facility]                      How to care for your drainage and suction unit at home Your drainage catheter will be connected to a Jackson-Pratt (JP) bulb that is a round and soft collection device.   The vacuum created when the device is compressed allows drainage to collect in the container.    Attach the JP bulb to your clothes with a safety pin to prevent tension on your skin and help prevent the tube from dislodging prematurely.    To compress the Jackson-Pratt Bulb:  . Release the plug at the top of the bulb (emptying port) . Squeeze the bulb tightly in your fist, squeezing air out of the bulb . Replace the plug while the bulb is compressed . Be careful not to spill the contents when squeezing the bulb  *The bulb must be compressed to have suction and initiate drainage *  To empty the collection device:  .  Marland Kitchen Release the plug on the top of the collection unit (emptying port) . Pour the fluid into a measuring container such as a plastic medicine cup . Record the date and the amount of drainage emptied.  This should be done at routine times every day      Please bring this record with you to each office visit Date  Amount of Drainage                                                            Contact your physician if any of the following occur: Marland Kitchen Drain or incisional area becomes red, swollen or hot to the touch. . You develop a fever greater than 100 degrees F. Marland Kitchen  Oozing at the skin insertion site - you may apply a gauze dressing. Niel Hummer falls out - apply gauze dressing over the drain insertion site. . Drainage is increasing daily unrelated to activity pattern.  Note: You will usually have more drainage as your activity increases.  May take Tylenol after 1pm, if needed.    Post Anesthesia Home Care Instructions  Activity: Get plenty of rest for the remainder of the day. A responsible individual must stay with you for 24 hours following the procedure.  For the next 24 hours, DO NOT: -Drive a car -Paediatric nurse -Drink alcoholic beverages -Take any medication unless instructed by your physician -Make any legal decisions or sign important papers.  Meals: Start with liquid foods such as gelatin or soup. Progress to regular foods as tolerated. Avoid greasy, spicy, heavy foods. If nausea and/or vomiting occur, drink only clear liquids until the nausea and/or vomiting subsides. Call your physician if vomiting continues.  Special Instructions/Symptoms: Your throat may feel dry or sore from the anesthesia or the breathing tube placed in your throat during surgery. If this causes discomfort, gargle with warm salt water. The discomfort should disappear within 24 hours.  If you had a scopolamine patch placed behind your ear for the management of post- operative nausea and/or vomiting:  1. The medication in the patch is effective for 72 hours, after which it should be removed.  Wrap patch in a tissue and discard in the trash. Wash hands thoroughly with soap and water. 2. You may remove the patch earlier than 72 hours if you experience unpleasant side effects which may include dry mouth,  dizziness or visual disturbances. 3. Avoid touching the patch. Wash your hands with soap and water after contact with the patch.

## 2020-08-24 NOTE — Anesthesia Postprocedure Evaluation (Signed)
Anesthesia Post Note  Patient: Heather Mcpherson  Procedure(s) Performed: UNILATERAL MAMMARY REDUCTION  (BREAST) (Left Breast)     Patient location during evaluation: PACU Anesthesia Type: General Level of consciousness: awake and alert and oriented Pain management: pain level controlled Vital Signs Assessment: post-procedure vital signs reviewed and stable Respiratory status: spontaneous breathing, nonlabored ventilation and respiratory function stable Cardiovascular status: blood pressure returned to baseline Postop Assessment: no apparent nausea or vomiting Anesthetic complications: no   No complications documented.  Last Vitals:  Vitals:   08/24/20 0915 08/24/20 0930  BP: (!) 149/89 (!) 149/89  Pulse: 85 74  Resp: 14 15  Temp:    SpO2: 99% 97%    Last Pain:  Vitals:   08/24/20 0930  TempSrc:   PainSc: 0-No pain                 Brennan Bailey

## 2020-08-24 NOTE — Interval H&P Note (Signed)
History and Physical Interval Note:  08/24/2020 6:58 AM  Heather Mcpherson  has presented today for surgery, with the diagnosis of history of breast cancer, breast asymmetry.  The various methods of treatment have been discussed with the patient and family. After consideration of risks, benefits and other options for treatment, the patient has consented to  Procedure(s) with comments: UNILATERAL MAMMARY REDUCTION  (BREAST) (Left) - 2 hours, please as a surgical intervention.  The patient's history has been reviewed, patient examined, no change in status, stable for surgery.  I have reviewed the patient's chart and labs.  Questions were answered to the patient's satisfaction.     [name]

## 2020-08-24 NOTE — Op Note (Signed)
Breast Reduction Op note:    DATE OF PROCEDURE: 08/24/2020  LOCATION: [place]  SURGEON: Lyndee Leo Sanger Sharlette Jansma, DO  ASSISTANT: Roetta Sessions, PA  PREOPERATIVE DIAGNOSIS 1. Breast asymmetry after treatment for breast cancer on the contralateral side. 2. Macromastia / Neck Pain / Back Pain  POSTOPERATIVE DIAGNOSIS Same as preoperative diagnosis  PROCEDURES 1. Left breast reduction 4098 g  COMPLICATIONS: None.  DRAINS: one  INDICATIONS FOR PROCEDURE Heather Mcpherson is a 65 y.o. year-old female born on [birth year],with a history of right breast cancer.  She underwent treatment and then radiation of the right breast.  She now has asymmetry and symptomatic macromastia with concominant back pain, neck pain, shoulder grooving from her bra.   MRN: [id number]  CONSENT Informed consent was obtained directly from the patient. The risks, benefits and alternatives were fully discussed. Specific risks including but not limited to bleeding, infection, hematoma, seroma, scarring, pain, nipple necrosis, asymmetry, poor cosmetic results, and need for further surgery were discussed. The patient had ample opportunity to have her questions answered to her satisfaction.  DESCRIPTION OF PROCEDURE  Patient was brought into the operating room and placed in a supine position.  SCDs were placed and appropriate padding was performed.  Antibiotics were given. The patient underwent general anesthesia and the chest was prepped and draped in a sterile fashion.  A timeout was performed and all information was confirmed to be correct.  Left side: Preoperative markings were confirmed.  Incision lines were injected with 1% Xylocaine with epinephrine.  After waiting for vasoconstriction, the marked lines were incised.  A Wise-pattern superomedial breast reduction was performed by de-epithelializing the pedicle, using bovie to create the superomedial pedicle, and removing breast tissue from  the superior, lateral, and inferior portions of the breast.  Care was taken to not undermine the breast pedicle. Hemostasis was achieved.  The nipple was gently rotated into position and the soft tissue was closed with 4-0 Monocryl.  The patient was sat upright and size and shape symmetry was confirmed.  The pocket was irrigated and hemostasis confirmed.  The deep tissues were approximated with 3-0 PDS and Monocryl sutures and the skin was closed with deep dermal and subcuticular 4-0 Monocryl sutures.  Dermabond was applied.  A breast binder and ABDs were placed.  The nipple and skin flaps had good capillary refill at the end of the procedure.  The patient tolerated the procedure well. The patient was allowed to wake from anesthesia and taken to the recovery room in satisfactory condition.  The advanced practice practitioner (APP) assisted throughout the case.  The APP was essential in retraction and counter traction when needed to make the case progress smoothly.  This retraction and assistance made it possible to see the tissue plans for the procedure.  The assistance was needed for blood control, tissue re-approximation and assisted with closure of the incision site.

## 2020-08-24 NOTE — Anesthesia Procedure Notes (Signed)
Procedure Name: Intubation Date/Time: 08/24/2020 7:34 AM Performed by: Willa Frater, CRNA Pre-anesthesia Checklist: Patient identified, Emergency Drugs available, Suction available and Patient being monitored Patient Re-evaluated:Patient Re-evaluated prior to induction Oxygen Delivery Method: Circle system utilized Preoxygenation: Pre-oxygenation with 100% oxygen Induction Type: IV induction Ventilation: Mask ventilation without difficulty Grade View: Grade II Tube type: Oral Tube size: 7.0 mm Number of attempts: 1 Airway Equipment and Method: Stylet and Oral airway Placement Confirmation: ETT inserted through vocal cords under direct vision,  positive ETCO2 and breath sounds checked- equal and bilateral Secured at: 23 cm Tube secured with: Tape Dental Injury: Teeth and Oropharynx as per pre-operative assessment  Difficulty Due To: Difficulty was anticipated, Difficult Airway- due to large tongue and Difficult Airway- due to reduced neck mobility

## 2020-08-24 NOTE — Transfer of Care (Signed)
Immediate Anesthesia Transfer of Care Note  Patient: Heather Mcpherson  Procedure(s) Performed: UNILATERAL MAMMARY REDUCTION  (BREAST) (Left Breast)  Patient Location: PACU  Anesthesia Type:General  Level of Consciousness: awake, alert , oriented, drowsy and patient cooperative  Airway & Oxygen Therapy: Patient Spontanous Breathing and Patient connected to face mask oxygen  Post-op Assessment: Report given to RN and Post -op Vital signs reviewed and stable  Post vital signs: Reviewed and stable  Last Vitals:  Vitals Value Taken Time  BP 151/93 08/24/20 0911  Temp    Pulse 82 08/24/20 0911  Resp 12 08/24/20 0911  SpO2 94 % 08/24/20 0911  Vitals shown include unvalidated device data.  Last Pain:  Vitals:   08/24/20 0640  TempSrc: Oral  PainSc: 0-No pain      Patients Stated Pain Goal: 6 (03/70/48 8891)  Complications: No complications documented.

## 2020-08-25 ENCOUNTER — Encounter (HOSPITAL_BASED_OUTPATIENT_CLINIC_OR_DEPARTMENT_OTHER): Payer: Self-pay | Admitting: Plastic Surgery

## 2020-08-29 LAB — SURGICAL PATHOLOGY

## 2020-09-05 ENCOUNTER — Encounter: Payer: Self-pay | Admitting: Plastic Surgery

## 2020-09-05 ENCOUNTER — Ambulatory Visit (INDEPENDENT_AMBULATORY_CARE_PROVIDER_SITE_OTHER): Payer: Medicare Other | Admitting: Plastic Surgery

## 2020-09-05 ENCOUNTER — Other Ambulatory Visit: Payer: Self-pay

## 2020-09-05 VITALS — BP 132/84 | HR 83

## 2020-09-05 DIAGNOSIS — N6489 Other specified disorders of breast: Secondary | ICD-10-CM

## 2020-09-05 NOTE — Progress Notes (Signed)
   Subjective:    Patient ID: Heather Mcpherson, female    DOB: 04/23/1956, 65 y.o.   MRN: [id number]  The patient is a 65 year old female here for follow-up after undergoing a left breast reduction for symmetry.  She had a partial mastectomy of the right breast and had significant asymmetries.  She is doing extremely well from her surgery.  She had over 1000 g removed from the left breast.  The drain output has been minimal.  I was able to remove the drain today.  There does not appear to be any seroma or hematoma.   Review of Systems  Constitutional: Negative.   Eyes: Negative.   Respiratory: Negative.   Genitourinary: Negative.        Objective:   Physical Exam Vitals and nursing note reviewed.  Constitutional:      Appearance: Normal appearance.  Cardiovascular:     Rate and Rhythm: Normal rate.     Pulses: Normal pulses.  Pulmonary:     Effort: Pulmonary effort is normal.  Neurological:     Mental Status: She is alert. Mental status is at baseline.  Psychiatric:        Mood and Affect: Mood normal.        Behavior: Behavior normal.         Assessment & Plan:     ICD-10-CM   1. Postoperative breast asymmetry  N64.89      Follow-up in 2 weeks.  She can start to remove the dressing as it starts peeling away in the shower.  Continue with Vaseline to the drain removal site that I removed today.  Call with any questions or concerns.

## 2020-09-26 ENCOUNTER — Encounter: Payer: Self-pay | Admitting: Surgical

## 2020-09-26 ENCOUNTER — Other Ambulatory Visit: Payer: Self-pay

## 2020-09-26 ENCOUNTER — Ambulatory Visit (INDEPENDENT_AMBULATORY_CARE_PROVIDER_SITE_OTHER): Payer: Medicare Other | Admitting: Surgical

## 2020-09-26 VITALS — BP 133/84 | HR 87

## 2020-09-26 DIAGNOSIS — C50911 Malignant neoplasm of unspecified site of right female breast: Secondary | ICD-10-CM

## 2020-09-26 DIAGNOSIS — N6489 Other specified disorders of breast: Secondary | ICD-10-CM

## 2020-09-26 DIAGNOSIS — Z170 Estrogen receptor positive status [ER+]: Secondary | ICD-10-CM

## 2020-09-26 NOTE — Progress Notes (Signed)
Patient is a 65 year old female here for follow-up after undergoing a left breast reduction for symmetry.  She previously had a partial mastectomy in the right breast.  She had over 1000 g removed from the left breast.  She reports she is doing really well, is very pleased with how things are healing.  Chaperone present on exam On exam left NAC is viable with good color.  Left breast incisions are intact.  She has some irritation surrounding the incisions but no cellulitic changes or signs of infection.  No fluid wave noted.    Recommend continue to avoid strenuous activity Continue to wear compressive garment 24/7 for a total of 6 weeks postoperatively Recommend following up on an as-needed basis.  Patient is in agreement with this, she will call if she has any questions or concerns. No sign of infection, seroma, hematoma  Pictures taken and placed in the patient's chart with her permission.

## 2021-02-08 ENCOUNTER — Other Ambulatory Visit: Payer: Self-pay | Admitting: Internal Medicine

## 2021-02-08 DIAGNOSIS — Z1231 Encounter for screening mammogram for malignant neoplasm of breast: Secondary | ICD-10-CM

## 2021-05-24 ENCOUNTER — Ambulatory Visit
Admission: RE | Admit: 2021-05-24 | Discharge: 2021-05-24 | Disposition: A | Discharge status: Home / Self Care | Payer: Medicare Other | Source: Ambulatory Visit | Attending: Internal Medicine | Admitting: Internal Medicine

## 2021-05-24 ENCOUNTER — Other Ambulatory Visit: Payer: Self-pay

## 2021-05-24 DIAGNOSIS — Z1231 Encounter for screening mammogram for malignant neoplasm of breast: Secondary | ICD-10-CM | POA: Insufficient documentation

## 2022-01-26 ENCOUNTER — Ambulatory Visit
Admission: EM | Admit: 2022-01-26 | Discharge: 2022-01-26 | Disposition: A | Discharge status: Home / Self Care | Payer: Medicare Other | Attending: Emergency Medicine | Admitting: Emergency Medicine

## 2022-01-26 DIAGNOSIS — J0140 Acute pansinusitis, unspecified: Secondary | ICD-10-CM

## 2022-01-26 DIAGNOSIS — H9201 Otalgia, right ear: Secondary | ICD-10-CM | POA: Diagnosis not present

## 2022-01-26 MED ORDER — OXYCODONE HCL 5 MG PO TABS: 5.0000 mg | ORAL_TABLET | Freq: Four times a day (QID) | ORAL | 0 refills | Status: AC | PRN

## 2022-01-26 MED ORDER — FLUCONAZOLE 150 MG PO TABS: 150.0000 mg | ORAL_TABLET | Freq: Every day | ORAL | 0 refills | Status: AC

## 2022-01-26 MED ORDER — AMOXICILLIN-POT CLAVULANATE 875-125 MG PO TABS: 1.0000 | ORAL_TABLET | Freq: Two times a day (BID) | ORAL | 0 refills | Status: AC

## 2022-01-26 NOTE — ED Provider Notes (Signed)
[facility]    CSN: [id number] Arrival date & time: 01/26/22  1429      History   Chief Complaint Chief Complaint  Patient presents with   Ear Problem    HPI Heather Mcpherson is a 66 y.o. female.   Patient presents with persistent right ear pain for at least 3 months, endorses full ear 1 day ago with severe pain radiating down the right side of the neck.  Has attempted use of Cortisporin eardrops which were ineffective.  Has been evaluated by PCP, ear nose and throat and dentist who recommended mouthguard which she endorses is ineffective and believes she does not grind her teeth.  Has accompanying nasal congestion as well as intermittent headaches.  Has not attempted treatment further.  Denies fever, chills, body aches, ear drainage, ear itching, decreased hearing or fullness.  No pertinent medical history.    Past Medical History:  Diagnosis Date   Ankle edema    Anxiety    Breast cancer ([place]) 2009   right breast, chemo and radiation   Cancer ([place])    breast   Chronic kidney disease    UTI   Depression    GERD (gastroesophageal reflux disease)    Hypercholesteremia    Hypothyroidism    Personal history of chemotherapy    Personal history of radiation therapy    PONV (postoperative nausea and vomiting)    many years ago   Seasonal allergies    Sleep apnea    CPAP   Thyroid disease     Patient Active Problem List   Diagnosis Date Noted   Postoperative breast asymmetry 07/07/2020   Cancer of right female breast ([place]) 06/19/2016   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    Past Surgical History:  Procedure Laterality Date   ABDOMINAL HYSTERECTOMY     BREAST BIOPSY Right 2011   negative, stereo   BREAST BIOPSY Right 2014   negative, stereo   BREAST EXCISIONAL BIOPSY Right 2009   positive   BREAST LUMPECTOMY Right 2009   BREAST REDUCTION SURGERY Left 08/24/2020   Procedure: UNILATERAL MAMMARY REDUCTION  (BREAST);  Surgeon: Wallace Going,  DO;  Location: [place];  Service: Plastics;  Laterality: Left;   BREAST SURGERY Right 10/23/2007   Lumpectomy   COLONOSCOPY WITH PROPOFOL N/A 01/19/2015   Procedure: COLONOSCOPY WITH PROPOFOL;  Surgeon: Josefine Class, MD;  Location[facility] ENDOSCOPY;  Service: Endoscopy;  Laterality: N/A;   evacuation of hematoma right breast     EYE SURGERY Bilateral    cataracts   HAMMERTOE RECONSTRUCTION WITH WEIL OSTEOTOMY Right 04/26/2019   Procedure: release hammertoe right second toe;  Surgeon: Earnestine Leys, MD;  Location: [facility] ORS;  Service: Orthopedics;  Laterality: Right;   HEEL SPUR EXCISION Bilateral    JOINT REPLACEMENT Right 07/14/2013   Right Knee Replacement   RETINAL DETACHMENT SURGERY Left 02/2006   surgery on heel, foot elbow     TOTAL KNEE ARTHROPLASTY Left 04/19/2015   Procedure: TOTAL KNEE ARTHROPLASTY;  Surgeon: Earnestine Leys, MD;  Location: [facility] ORS;  Service: Orthopedics;  Laterality: Left;    OB History   No obstetric history on file.      Home Medications    Prior to Admission medications   Medication Sig Start Date End Date Taking? Authorizing Provider  atorvastatin (LIPITOR) 10 MG tablet Take 10 mg by mouth at bedtime.  06/12/14   [provider]  carboxymethylcellulose (REFRESH PLUS) 0.5 % SOLN Place 1  drop into both eyes 2 (two) times daily as needed (Dry eyes).    [provider]  celecoxib (CELEBREX) 200 MG capsule Take 200 mg by mouth daily.  06/12/14   [provider]  Cholecalciferol (VITAMIN D) 50 MCG (2000 UT) tablet Take 2,000 Units by mouth 2 (two) times daily. Gummie    [provider]  Dexlansoprazole (DEXILANT) 30 MG capsule Take 30 mg by mouth daily.    [provider]  fluticasone (FLONASE) 50 MCG/ACT nasal spray Place 2 sprays into both nostrils daily as needed for allergies or rhinitis.    [provider]  loratadine (CLARITIN) 10 MG tablet Take 10 mg by mouth daily.     [provider]  Magnesium 200 MG TABS Take 200 mg by mouth at bedtime.     [provider]  Melatonin 10 MG TABS Take 10 mg by mouth at bedtime.    [provider]  montelukast (SINGULAIR) 10 MG tablet Take 10 mg by mouth daily. 05/16/20   [provider]  SYNTHROID 112 MCG tablet Take 112 mcg by mouth daily.  06/12/14   [provider]  venlafaxine XR (EFFEXOR-XR) 150 MG 24 hr capsule Take 225 mg by mouth daily. Take with 75 mg for a total of 225 mg 06/12/14   [provider]    Family History Family History  Problem Relation Age of Onset   Breast cancer Neg Hx     Social History Social History   Tobacco Use   Smoking status: Passive Smoke Exposure - Never Smoker   Smokeless tobacco: Never  Vaping Use   Vaping Use: Never used  Substance Use Topics   Alcohol use: No    Alcohol/week: 0.0 standard drinks of alcohol   Drug use: No     Allergies   Hydrochlorothiazide   Review of Systems Review of Systems   Physical Exam Triage Vital Signs ED Triage Vitals  Enc Vitals Group     BP --      Pulse --      Resp --      Temp --      Temp src --      SpO2 --      Weight 01/26/22 1457 214 lb (97.1 kg)     Height 01/26/22 1457 '5\' 8"'$  (1.727 m)     Head Circumference --      Peak Flow --      Pain Score 01/26/22 1456 10     Pain Loc --      Pain Edu? --      Excl. in [place]? --    No data found.  Updated Vital Signs Ht '5\' 8"'$  (1.727 m)   Wt 214 lb (97.1 kg)   BMI 32.54 kg/m   Visual Acuity Right Eye Distance:   Left Eye Distance:   Bilateral Distance:    Right Eye Near:   Left Eye Near:    Bilateral Near:     Physical Exam Constitutional:      Appearance: Normal appearance.  HENT:     Head: Normocephalic.     Right Ear: Tympanic membrane, ear canal and external ear normal.     Left Ear: Tympanic membrane, ear canal and external ear normal.     Nose: Congestion present.     Right Sinus: Maxillary sinus  tenderness and frontal sinus tenderness present.     Left Sinus: Maxillary sinus tenderness and frontal sinus tenderness present.  Mouth/Throat:     Mouth: Mucous membranes are moist.     Pharynx: Oropharynx is clear.  Pulmonary:     Effort: Pulmonary effort is normal.  Neurological:     Mental Status: She is alert and oriented to person, place, and time.      UC Treatments / Results  Labs (all labs ordered are listed, but only abnormal results are displayed) Labs Reviewed - No data to display  EKG   Radiology No results found.  Procedures Procedures (including critical care time)  Medications Ordered in UC Medications - No data to display  Initial Impression / Assessment and Plan / UC Course  I have reviewed the triage vital signs and the nursing notes.  Pertinent labs & imaging results that were available during my care of the patient were reviewed by me and considered in my medical decision making (see chart for details).  Subacute pansinusitis, otalgia of right ear  No abnormalities are noted to the bilateral ears, discussed with patient as she has associated congestion, mild tenderness to the sinus cavities on exam and headaches we will provide coverage today for sinusitis, Augmentin 10-day course prescribed as well as prophylactic Diflucan and oxycodone for management of discomfort, may use additional antihistamines, Flonase and Mucinex for supportive care, recommended follow-up with ear nose and throat if symptoms continue to persist for reevaluation Final Clinical Impressions(s) / UC Diagnoses   Final diagnoses:  None   Discharge Instructions   None    ED Prescriptions   None    PDMP not reviewed this encounter.   Hans Eden, NP 01/26/22 1551

## 2022-01-26 NOTE — Discharge Instructions (Signed)
On exam there are no abnormalities to your right ear however as you have accompanying congestion and associated headaches and tenderness within your lymph nodes we will provide coverage for a sinus infection which may be aiding to your symptoms  Take Augmentin every morning and every evening for the next 10 days, ideally if this is contributing to your symptoms you begin to see improvement in the next 24 to 48 hours, Diflucan has been prescribed for you prophylactically, if you begin to have yeast and symptoms take 1 tablet now and take the second tablet after completion of antibiotics  You may continue use of daily Flonase, may also use over-the-counter Mucinex to help clear drainage as well  For your severe pain you may use oxycodone as needed every 6 hours, be mindful you will only be dispensed 12 tablets therefore use sparingly  If your symptoms continue to persist please follow-up with your ear nose and throat specialist for reevaluation

## 2022-01-26 NOTE — ED Triage Notes (Signed)
Pt c/o rt ear pain.  Pt states that she was seen by her PCP, ENT, and a dentist for a ear ache.  Pt was given hydrocortisone-acetic acid solution to drop into the ear.  Pt states that today she is having stabbing pain from the right temple through the front and back of the head/face/ear to the neck.

## 2022-03-07 ENCOUNTER — Other Ambulatory Visit: Payer: Self-pay | Admitting: Internal Medicine

## 2022-03-07 DIAGNOSIS — Z1231 Encounter for screening mammogram for malignant neoplasm of breast: Secondary | ICD-10-CM

## 2022-05-27 ENCOUNTER — Ambulatory Visit
Admission: RE | Admit: 2022-05-27 | Discharge: 2022-05-27 | Disposition: A | Discharge status: Home / Self Care | Payer: Medicare Other | Source: Ambulatory Visit | Attending: Internal Medicine | Admitting: Internal Medicine

## 2022-05-27 DIAGNOSIS — Z1231 Encounter for screening mammogram for malignant neoplasm of breast: Secondary | ICD-10-CM | POA: Diagnosis present

## 2022-09-01 ENCOUNTER — Encounter: Payer: Self-pay | Admitting: Emergency Medicine

## 2022-09-01 ENCOUNTER — Ambulatory Visit
Admission: EM | Admit: 2022-09-01 | Discharge: 2022-09-01 | Disposition: A | Discharge status: Home / Self Care | Payer: Medicare Other | Attending: Emergency Medicine | Admitting: Emergency Medicine

## 2022-09-01 DIAGNOSIS — U071 COVID-19: Secondary | ICD-10-CM | POA: Diagnosis present

## 2022-09-01 LAB — RESP PANEL BY RT-PCR (RSV, FLU A&B, COVID)  RVPGX2
Influenza A by PCR: NEGATIVE
Influenza B by PCR: NEGATIVE
Resp Syncytial Virus by PCR: NEGATIVE
SARS Coronavirus 2 by RT PCR: POSITIVE — AB

## 2022-09-01 LAB — GROUP A STREP BY PCR: Group A Strep by PCR: NOT DETECTED

## 2022-09-01 MED ORDER — IPRATROPIUM BROMIDE 0.06 % NA SOLN: 2.0000 | Freq: Four times a day (QID) | NASAL | 12 refills | Status: DC

## 2022-09-01 MED ORDER — PROMETHAZINE-DM 6.25-15 MG/5ML PO SYRP: 5.0000 mL | ORAL_SOLUTION | Freq: Four times a day (QID) | ORAL | 0 refills | Status: DC | PRN

## 2022-09-01 MED ORDER — NIRMATRELVIR/RITONAVIR (PAXLOVID)TABLET: 3.0000 | ORAL_TABLET | Freq: Two times a day (BID) | ORAL | 0 refills | Status: AC

## 2022-09-01 MED ORDER — BENZONATATE 100 MG PO CAPS: 200.0000 mg | ORAL_CAPSULE | Freq: Three times a day (TID) | ORAL | 0 refills | Status: DC

## 2022-09-01 NOTE — ED Provider Notes (Signed)
[facility]    CSN: [id number] Arrival date & time: 09/01/22  0800      History   Chief Complaint Chief Complaint  Patient presents with   Cough   Nasal Congestion    HPI Heather Mcpherson is a 67 y.o. female.   HPI  67 year old female here for evaluation of respiratory complaints.  The patient has a past medical history that significant for breast cancer, GERD, sleep apnea, chronic kidney disease, hypercholesterolemia, hypothyroidism, anxiety and depression presenting for evaluation of 2 days worth of COVID/flulike symptoms that consist of headache, body aches, ear pain that is worse on the left than the right, nasal congestion with clear nasal discharge, sinus pressure, and a nonproductive cough.  She denies fever, shortness of breath, wheezing, or GI complaints.  Her boyfriend has similar symptoms.  Past Medical History:  Diagnosis Date   Ankle edema    Anxiety    Breast cancer ([place]) 2009   right breast, chemo and radiation   Cancer ([place])    breast   Chronic kidney disease    UTI   Depression    GERD (gastroesophageal reflux disease)    Hypercholesteremia    Hypothyroidism    Personal history of chemotherapy    Personal history of radiation therapy    PONV (postoperative nausea and vomiting)    many years ago   Seasonal allergies    Sleep apnea    CPAP   Thyroid disease     Patient Active Problem List   Diagnosis Date Noted   Postoperative breast asymmetry 07/07/2020   Cancer of right female breast ([place]) 06/19/2016   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    Past Surgical History:  Procedure Laterality Date   ABDOMINAL HYSTERECTOMY     BREAST BIOPSY Right 2011   negative, stereo   BREAST BIOPSY Right 2014   negative, stereo   BREAST EXCISIONAL BIOPSY Right 2009   positive   BREAST LUMPECTOMY Right 2009   BREAST REDUCTION SURGERY Left 08/24/2020   Procedure: UNILATERAL MAMMARY REDUCTION  (BREAST);  Surgeon: Wallace Going, DO;   Location: [place];  Service: Plastics;  Laterality: Left;   BREAST SURGERY Right 10/23/2007   Lumpectomy   COLONOSCOPY WITH PROPOFOL N/A 01/19/2015   Procedure: COLONOSCOPY WITH PROPOFOL;  Surgeon: Josefine Class, MD;  Location[facility] END[facility]COPY;  Service: Endoscopy;  Laterality: N/A;   evacuation of hematoma right breast     EYE SURGERY Bilateral    cataracts   HAMMERTOE RECONSTRUCTION WITH WEIL OSTEOTOMY Right 04/26/2019   Procedure: release hammertoe right second toe;  Surgeon: Earnestine Leys, MD;  Location: [facility] ORS;  Service: Orthopedics;  Laterality: Right;   HEEL SPUR EXCISION Bilateral    JOINT REPLACEMENT Right 07/14/2013   Right Knee Replacement   RETINAL DETACHMENT SURGERY Left 02/2006   surgery on heel, foot elbow     TOTAL KNEE ARTHROPLASTY Left 04/19/2015   Procedure: TOTAL KNEE ARTHROPLASTY;  Surgeon: Earnestine Leys, MD;  Location: [facility] ORS;  Service: Orthopedics;  Laterality: Left;    OB History   No obstetric history on file.      Home Medications    Prior to Admission medications   Medication Sig Start Date End Date Taking? Authorizing Provider  atorvastatin (LIPITOR) 10 MG tablet Take 10 mg by mouth at bedtime.  06/12/14  Yes [provider]  benzonatate (TESSALON) 100 MG capsule Take 2 capsules (200 mg total) by mouth every 8 (eight) hours. 09/01/22  Yes Margarette Canada, NP  Dexlansoprazole (DEXILANT) 30 MG capsule Take 30 mg by mouth daily.   Yes [provider]  fluticasone (FLONASE) 50 MCG/ACT nasal spray Place 2 sprays into both nostrils daily as needed for allergies or rhinitis.   Yes [provider]  ipratropium (ATROVENT) 0.06 % nasal spray Place 2 sprays into both nostrils 4 (four) times daily. 09/01/22  Yes Margarette Canada, NP  Magnesium 200 MG TABS Take 200 mg by mouth at bedtime.    Yes [provider]  montelukast (SINGULAIR) 10 MG tablet Take 10 mg by mouth daily. 05/16/20  Yes [provider]   nirmatrelvir/ritonavir (PAXLOVID) 20 x 150 MG & 10 x '100MG'$  TABS Take 3 tablets by mouth 2 (two) times daily for 5 days. 09/01/22 09/06/22 Yes Margarette Canada, NP  promethazine-dextromethorphan (PROMETHAZINE-DM) 6.25-15 MG/5ML syrup Take 5 mLs by mouth 4 (four) times daily as needed. 09/01/22  Yes Margarette Canada, NP  SYNTHROID 112 MCG tablet Take 112 mcg by mouth daily.  06/12/14  Yes [provider]  venlafaxine XR (EFFEXOR-XR) 150 MG 24 hr capsule Take 225 mg by mouth daily. Take with 75 mg for a total of 225 mg 06/12/14  Yes [provider]  carboxymethylcellulose (REFRESH PLUS) 0.5 % SOLN Place 1 drop into both eyes 2 (two) times daily as needed (Dry eyes).    [provider]  celecoxib (CELEBREX) 200 MG capsule Take 200 mg by mouth daily.  06/12/14   [provider]  Cholecalciferol (VITAMIN D) 50 MCG (2000 UT) tablet Take 2,000 Units by mouth 2 (two) times daily. Gummie    [provider]  loratadine (CLARITIN) 10 MG tablet Take 10 mg by mouth daily.    [provider]  Melatonin 10 MG TABS Take 10 mg by mouth at bedtime.    [provider]    Family History Family History  Problem Relation Age of Onset   Breast cancer Neg Hx     Social History Social History   Tobacco Use   Smoking status: Passive Smoke Exposure - Never Smoker   Smokeless tobacco: Never  Vaping Use   Vaping Use: Never used  Substance Use Topics   Alcohol use: No    Alcohol/week: 0.0 standard drinks of alcohol   Drug use: No     Allergies   Hydrochlorothiazide   Review of Systems Review of Systems  Constitutional:  Negative for fever.  HENT:  Positive for congestion, ear pain, rhinorrhea, sinus pressure and sore throat.   Respiratory:  Positive for cough. Negative for shortness of breath and wheezing.   Gastrointestinal:  Negative for diarrhea, nausea and vomiting.  Musculoskeletal:  Positive for arthralgias and myalgias.  Neurological:  Positive  for headaches.     Physical Exam Triage Vital Signs ED Triage Vitals [09/01/22 0810]  Enc Vitals Group     BP      Pulse      Resp      Temp      Temp src      SpO2      Weight 202 lb 2.6 oz (91.7 kg)     Height '5\' 8"'$  (1.727 m)     Head Circumference      Peak Flow      Pain Score 8     Pain Loc      Pain Edu?      Excl. in [place]?    No data found.  Updated Vital Signs BP 134/73 (  BP Location: Left Arm)   Pulse 93   Temp 98.4 F (36.9 C) (Oral)   Resp 14   Ht '5\' 8"'$  (1.727 m)   Wt 202 lb 2.6 oz (91.7 kg)   SpO2 97%   BMI 30.74 kg/m   Visual Acuity Right Eye Distance:   Left Eye Distance:   Bilateral Distance:    Right Eye Near:   Left Eye Near:    Bilateral Near:     Physical Exam Vitals and nursing note reviewed.  Constitutional:      Appearance: Normal appearance. She is not ill-appearing.  HENT:     Head: Normocephalic and atraumatic.     Right Ear: Tympanic membrane, ear canal and external ear normal. There is no impacted cerumen.     Left Ear: Tympanic membrane, ear canal and external ear normal. There is no impacted cerumen.     Nose: Congestion and rhinorrhea present.     Comments: Nasal mucosa is mildly edematous and erythematous with scant clear discharge in both nares.    Mouth/Throat:     Mouth: Mucous membranes are moist.     Pharynx: Oropharynx is clear. Posterior oropharyngeal erythema present. No oropharyngeal exudate.     Comments: Mild erythema to the posterior oropharynx with clear postnasal drip.  No exudate appreciated. Cardiovascular:     Rate and Rhythm: Normal rate and regular rhythm.     Pulses: Normal pulses.     Heart sounds: Normal heart sounds. No murmur heard.    No friction rub. No gallop.  Pulmonary:     Effort: Pulmonary effort is normal.     Breath sounds: Normal breath sounds. No wheezing, rhonchi or rales.  Musculoskeletal:     Cervical back: Normal range of motion and neck supple.  Lymphadenopathy:     Cervical: No  cervical adenopathy.  Skin:    General: Skin is warm and dry.     Capillary Refill: Capillary refill takes less than 2 seconds.     Findings: No rash.  Neurological:     General: No focal deficit present.     Mental Status: She is alert and oriented to person, place, and time.      UC Treatments / Results  Labs (all labs ordered are listed, but only abnormal results are displayed) Labs Reviewed  RESP PANEL BY RT-PCR (RSV, FLU A&B, COVID)  RVPGX2 - Abnormal; Notable for the following components:      Result Value   SARS Coronavirus 2 by RT PCR POSITIVE (*)    All other components within normal limits  GROUP A STREP BY PCR    EKG   Radiology No results found.  Procedures Procedures (including critical care time)  Medications Ordered in UC Medications - No data to display  Initial Impression / Assessment and Plan / UC Course  I have reviewed the triage vital signs and the nursing notes.  Pertinent labs & imaging results that were available during my care of the patient were reviewed by me and considered in my medical decision making (see chart for details).   The patient is a pleasant 67 year old female presenting for evaluation 2 days worth of COVID/flulike symptoms outlined the HPI above.  Her vital signs are stable and she is afebrile at 98.4, heart rate 93, respiratory 14, room air oxygen saturation 97%.  She is able to speak in full sentences without dyspnea or tachypnea.  She does have inflammation of her upper respiratory tract with clear rhinorrhea and postnasal  drip.  She is complaining of a mild sore throat as well so I will order respiratory viral panel as well as a strep PCR.  Respiratory panel is positive for COVID but negative for flu and RSV.  Strep PCR is negative.  Patient has a CMP in epic from 04/05/2022 showing a GFR of 80.  Given her significant past medical history she is a candidate for antiviral therapy and I will prescribe her Paxlovid along with  Tessalon Perles, Promethazine DM cough syrup, and Atrovent nasal spray to help with cough and congestion.  ER and return precautions reviewed.   Final Clinical Impressions(s) / UC Diagnoses   Final diagnoses:  [id number]     Discharge Instructions      You have tested positive for COVID-19 today.  The new guidelines as of March 1 from the CDC states that you do not have to quarantine unless you have a fever.  Since you have not had a fever you do not need to quarantine but you do need to wear a mask around folks for the first 5 days of your symptoms.  You can use over-the-counter Tylenol and or ibuprofen as needed for headache and body aches.  I am going to prescribe you Paxlovid, the antiviral for COVID-19, please take it twice daily for 5 days.  Use the Atrovent nasal spray, 2 squirts in each nostril every 6 hours, as needed for runny nose and postnasal drip.  Use the Tessalon Perles every 8 hours during the day.  Take them with a small sip of water.  They may give you some numbness to the base of your tongue or a metallic taste in your mouth, this is normal.  Use the Promethazine DM cough syrup at bedtime for cough and congestion.  It will make you drowsy so do not take it during the day.  If you develop any shortness of breath, especially at rest, feel as though you cannot catch her breath, you are unable to speak in full sentences, or your lips begin turning blue you need to call 911 and go to the ER.     ED Prescriptions     Medication Sig Dispense Auth. Provider   benzonatate (TESSALON) 100 MG capsule Take 2 capsules (200 mg total) by mouth every 8 (eight) hours. 21 capsule Margarette Canada, NP   ipratropium (ATROVENT) 0.06 % nasal spray Place 2 sprays into both nostrils 4 (four) times daily. 15 mL Margarette Canada, NP   nirmatrelvir/ritonavir (PAXLOVID) 20 x 150 MG & 10 x '100MG'$  TABS Take 3 tablets by mouth 2 (two) times daily for 5 days. 30 tablet Margarette Canada, NP    promethazine-dextromethorphan (PROMETHAZINE-DM) 6.25-15 MG/5ML syrup Take 5 mLs by mouth 4 (four) times daily as needed. 118 mL Margarette Canada, NP      PDMP not reviewed this encounter.   Margarette Canada, NP 09/01/22 [phone]

## 2022-09-01 NOTE — Discharge Instructions (Addendum)
You have tested positive for COVID-19 today.  The new guidelines as of March 1 from the CDC states that you do not have to quarantine unless you have a fever.  Since you have not had a fever you do not need to quarantine but you do need to wear a mask around folks for the first 5 days of your symptoms.  You can use over-the-counter Tylenol and or ibuprofen as needed for headache and body aches.  I am going to prescribe you Paxlovid, the antiviral for COVID-19, please take it twice daily for 5 days.  Use the Atrovent nasal spray, 2 squirts in each nostril every 6 hours, as needed for runny nose and postnasal drip.  Use the Tessalon Perles every 8 hours during the day.  Take them with a small sip of water.  They may give you some numbness to the base of your tongue or a metallic taste in your mouth, this is normal.  Use the Promethazine DM cough syrup at bedtime for cough and congestion.  It will make you drowsy so do not take it during the day.  If you develop any shortness of breath, especially at rest, feel as though you cannot catch her breath, you are unable to speak in full sentences, or your lips begin turning blue you need to call 911 and go to the ER.

## 2022-09-01 NOTE — ED Triage Notes (Signed)
Patient c/o cough, congestion, sinus pressure, headaches and bodyaches that on Friday.  Patient unsure of fevers.  Patient took Tylenol this morning.

## 2023-02-20 ENCOUNTER — Other Ambulatory Visit: Payer: Self-pay | Admitting: Physician Assistant

## 2023-02-20 DIAGNOSIS — N63 Unspecified lump in unspecified breast: Secondary | ICD-10-CM

## 2023-02-20 DIAGNOSIS — N6325 Unspecified lump in the left breast, overlapping quadrants: Secondary | ICD-10-CM

## 2023-02-20 DIAGNOSIS — N6320 Unspecified lump in the left breast, unspecified quadrant: Secondary | ICD-10-CM

## 2023-02-25 ENCOUNTER — Ambulatory Visit
Admission: RE | Admit: 2023-02-25 | Discharge: 2023-02-25 | Disposition: A | Discharge status: Home / Self Care | Payer: Medicare Other | Source: Ambulatory Visit | Attending: Physician Assistant | Admitting: Physician Assistant

## 2023-02-25 DIAGNOSIS — N6325 Unspecified lump in the left breast, overlapping quadrants: Secondary | ICD-10-CM

## 2023-04-24 ENCOUNTER — Other Ambulatory Visit: Payer: Self-pay | Admitting: Internal Medicine

## 2023-04-24 DIAGNOSIS — Z1231 Encounter for screening mammogram for malignant neoplasm of breast: Secondary | ICD-10-CM

## 2023-05-29 ENCOUNTER — Encounter: Payer: Self-pay | Admitting: General Surgery

## 2023-05-29 ENCOUNTER — Other Ambulatory Visit: Payer: Self-pay | Admitting: General Surgery

## 2023-05-29 ENCOUNTER — Ambulatory Visit
Admission: RE | Admit: 2023-05-29 | Discharge: 2023-05-29 | Disposition: A | Discharge status: Home / Self Care | Payer: Medicare Other | Source: Ambulatory Visit | Attending: Internal Medicine | Admitting: Internal Medicine

## 2023-05-29 DIAGNOSIS — Z1231 Encounter for screening mammogram for malignant neoplasm of breast: Secondary | ICD-10-CM | POA: Insufficient documentation

## 2023-05-29 DIAGNOSIS — N6313 Unspecified lump in the right breast, lower outer quadrant: Secondary | ICD-10-CM

## 2023-06-16 ENCOUNTER — Encounter: Payer: Self-pay | Admitting: Emergency Medicine

## 2023-06-16 ENCOUNTER — Ambulatory Visit
Admission: EM | Admit: 2023-06-16 | Discharge: 2023-06-16 | Disposition: A | Discharge status: Home / Self Care | Payer: Medicare Other | Attending: Emergency Medicine | Admitting: Emergency Medicine

## 2023-06-16 DIAGNOSIS — J09X2 Influenza due to identified novel influenza A virus with other respiratory manifestations: Secondary | ICD-10-CM | POA: Insufficient documentation

## 2023-06-16 LAB — RESP PANEL BY RT-PCR (FLU A&B, COVID) ARPGX2
Influenza A by PCR: POSITIVE — AB
Influenza B by PCR: NEGATIVE
SARS Coronavirus 2 by RT PCR: NEGATIVE

## 2023-06-16 MED ORDER — PROMETHAZINE-DM 6.25-15 MG/5ML PO SYRP: 5.0000 mL | ORAL_SOLUTION | Freq: Four times a day (QID) | ORAL | 0 refills | Status: AC | PRN

## 2023-06-16 MED ORDER — BENZONATATE 100 MG PO CAPS
200.0000 mg | ORAL_CAPSULE | Freq: Three times a day (TID) | ORAL | 0 refills | Status: AC
Start: 2023-06-16 — End: ?

## 2023-06-16 MED ORDER — OSELTAMIVIR PHOSPHATE 75 MG PO CAPS
75.0000 mg | ORAL_CAPSULE | Freq: Two times a day (BID) | ORAL | 0 refills | Status: AC
Start: 2023-06-16 — End: ?

## 2023-06-16 MED ORDER — IPRATROPIUM BROMIDE 0.06 % NA SOLN: 2.0000 | Freq: Four times a day (QID) | NASAL | 12 refills | Status: AC

## 2023-06-16 NOTE — Discharge Instructions (Addendum)
Take the Tamiflu twice daily for 5 days for treatment of influenza.  Use the Atrovent nasal spray, 2 squirts up each nostril every 6 hours, as needed for nasal congestion and runny nose.  Use over-the-counter Tylenol and/or ibuprofen as needed for fever or pain..  Use the Tessalon Perles every 8 hours as needed for cough.  Taken with a small sip of water.  You may experience some numbness to your tongue or metallic taste in her mouth, this is normal.  Use the Promethazine DM cough syrup at bedtime as will make you drowsy but it should help dry up your postnasal drip and aid you in sleep and cough relief.  Return for reevaluation, or see your primary care provider, for new or worsening symptoms.

## 2023-06-16 NOTE — ED Triage Notes (Signed)
Pt presents with a  dry cough, sore throat and chest congestion that started today.

## 2023-06-16 NOTE — ED Provider Notes (Signed)
[facility]    CSN: [id number] Arrival date & time: 06/16/23  1911      History   Chief Complaint Chief Complaint  Patient presents with   Cough   Sore Throat    HPI Heather Mcpherson is a 67 y.o. female.   HPI  67 year old female with a past medical history significant for thyroid disease, hypercholesterolemia, GERD, sleep apnea, anxiety, and right breast cancer presents for evaluation of runny nose, nasal congestion, sore throat, and nonproductive cough that started this morning.  The cough began first and then the sore throat came on this evening.  Patient denies fever or ear pain.  Past Medical History:  Diagnosis Date   Ankle edema    Anxiety    Breast cancer (HCC) 2009   right breast, chemo and radiation   Cancer (HCC)    breast   Chronic kidney disease    UTI   Depression    GERD (gastroesophageal reflux disease)    Hypercholesteremia    Hypothyroidism    Personal history of chemotherapy    Personal history of radiation therapy    PONV (postoperative nausea and vomiting)    many years ago   Seasonal allergies    Sleep apnea    CPAP   Thyroid disease     Patient Active Problem List   Diagnosis Date Noted   Postoperative breast asymmetry 07/07/2020   Cancer of right female breast (HCC) 06/19/2016   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    Past Surgical History:  Procedure Laterality Date   ABDOMINAL HYSTERECTOMY     BREAST BIOPSY Right 2011   negative, stereo   BREAST BIOPSY Right 2014   negative, stereo   BREAST EXCISIONAL BIOPSY Right 2009   positive   BREAST LUMPECTOMY Right 2009   BREAST REDUCTION SURGERY Left 08/24/2020   Procedure: UNILATERAL MAMMARY REDUCTION  (BREAST);  Surgeon: Peggye Form, DO;  Location: [facility];  Service: Plastics;  Laterality: Left;   BREAST SURGERY Right 10/23/2007   Lumpectomy   COLONOSCOPY WITH PROPOFOL N/A 01/19/2015   Procedure: COLONOSCOPY WITH PROPOFOL;  Surgeon:  Elnita Maxwell, MD;  Location: [facility];  Service: Endoscopy;  Laterality: N/A;   evacuation of hematoma right breast     EYE SURGERY Bilateral    cataracts   HAMMERTOE RECONSTRUCTION WITH WEIL OSTEOTOMY Right 04/26/2019   Procedure: release hammertoe right second toe;  Surgeon: Deeann Saint, MD;  Location: [facility];  Service: Orthopedics;  Laterality: Right;   HEEL SPUR EXCISION Bilateral    JOINT REPLACEMENT Right 07/14/2013   Right Knee Replacement   RETINAL DETACHMENT SURGERY Left 02/2006   surgery on heel, foot elbow     TOTAL KNEE ARTHROPLASTY Left 04/19/2015   Procedure: TOTAL KNEE ARTHROPLASTY;  Surgeon: Deeann Saint, MD;  Location: [facility];  Service: Orthopedics;  Laterality: Left;    OB History   No obstetric history on file.      Home Medications    Prior to Admission medications   Medication Sig Start Date End Date Taking? Authorizing Provider  oseltamivir (TAMIFLU) 75 MG capsule Take 1 capsule (75 mg total) by mouth every 12 (twelve) hours. 06/16/23  Yes Becky Augusta, NP  atorvastatin (LIPITOR) 10 MG tablet Take 10 mg by mouth at bedtime.  06/12/14   [provider]  benzonatate (TESSALON) 100 MG capsule Take 2 capsules (200 mg total) by mouth every 8 (eight) hours. 06/16/23   Becky Augusta, NP  carboxymethylcellulose (  REFRESH PLUS) 0.5 % SOLN Place 1 drop into both eyes 2 (two) times daily as needed (Dry eyes).    [provider]  celecoxib (CELEBREX) 200 MG capsule Take 200 mg by mouth daily.  06/12/14   [provider]  Cholecalciferol (VITAMIN D) 50 MCG (2000 UT) tablet Take 2,000 Units by mouth 2 (two) times daily. Gummie    [provider]  Dexlansoprazole (DEXILANT) 30 MG capsule Take 30 mg by mouth daily.    [provider]  fluticasone (FLONASE) 50 MCG/ACT nasal spray Place 2 sprays into both nostrils daily as needed for allergies or rhinitis.    [provider]  ipratropium (ATROVENT) 0.06 %  nasal spray Place 2 sprays into both nostrils 4 (four) times daily. 06/16/23   Becky Augusta, NP  loratadine (CLARITIN) 10 MG tablet Take 10 mg by mouth daily.    [provider]  Magnesium 200 MG TABS Take 200 mg by mouth at bedtime.     [provider]  Melatonin 10 MG TABS Take 10 mg by mouth at bedtime.    [provider]  montelukast (SINGULAIR) 10 MG tablet Take 10 mg by mouth daily. 05/16/20   [provider]  promethazine-dextromethorphan (PROMETHAZINE-DM) 6.25-15 MG/5ML syrup Take 5 mLs by mouth 4 (four) times daily as needed. 06/16/23   Becky Augusta, NP  SYNTHROID 112 MCG tablet Take 112 mcg by mouth daily.  06/12/14   [provider]  venlafaxine XR (EFFEXOR-XR) 150 MG 24 hr capsule Take 225 mg by mouth daily. Take with 75 mg for a total of 225 mg 06/12/14   [provider]    Family History Family History  Problem Relation Age of Onset   Breast cancer Neg Hx     Social History Social History   Tobacco Use   Smoking status: Passive Smoke Exposure - Never Smoker   Smokeless tobacco: Never  Vaping Use   Vaping status: Never Used  Substance Use Topics   Alcohol use: No    Alcohol/week: 0.0 standard drinks of alcohol   Drug use: No     Allergies   Hydrochlorothiazide   Review of Systems Review of Systems  Constitutional:  Negative for fever.  HENT:  Positive for congestion, rhinorrhea and sore throat. Negative for ear pain.   Respiratory:  Positive for cough. Negative for shortness of breath and wheezing.      Physical Exam Triage Vital Signs ED Triage Vitals  Encounter Vitals Group     BP 06/16/23 1927 133/70     Systolic BP Percentile --      Diastolic BP Percentile --      Pulse Rate 06/16/23 1927 91     Resp 06/16/23 1927 16     Temp 06/16/23 1927 98.6 F (37 C)     Temp src --      SpO2 06/16/23 1927 100 %     Weight --      Height --      Head Circumference --      Peak Flow --      Pain  Score 06/16/23 1926 0     Pain Loc --      Pain Education --      Exclude from Growth Chart --    No data found.  Updated Vital Signs BP 133/70 (BP Location: Left Arm)   Pulse 91   Temp 98.6 F (37 C)   Resp 16   SpO2 100%  Visual Acuity Right Eye Distance:   Left Eye Distance:   Bilateral Distance:    Right Eye Near:   Left Eye Near:    Bilateral Near:     Physical Exam Vitals and nursing note reviewed.  Constitutional:      Appearance: Normal appearance. She is not ill-appearing.  HENT:     Head: Normocephalic and atraumatic.     Right Ear: Tympanic membrane, ear canal and external ear normal. There is no impacted cerumen.     Left Ear: Tympanic membrane, ear canal and external ear normal. There is no impacted cerumen.     Nose: Congestion and rhinorrhea present.     Comments: Nasal mucosa is erythematous and edematous with clear discharge in both nares.    Mouth/Throat:     Mouth: Mucous membranes are moist.     Pharynx: Oropharynx is clear. Posterior oropharyngeal erythema present. No oropharyngeal exudate.     Comments: Tonsillar pillars are unremarkable.  Posterior oropharynx demonstrates erythema with clear postnasal drip. Cardiovascular:     Rate and Rhythm: Normal rate and regular rhythm.     Pulses: Normal pulses.     Heart sounds: Normal heart sounds. No murmur heard.    No friction rub. No gallop.  Pulmonary:     Effort: Pulmonary effort is normal.     Breath sounds: Normal breath sounds. No wheezing, rhonchi or rales.  Musculoskeletal:     Cervical back: Normal range of motion and neck supple. No tenderness.  Lymphadenopathy:     Cervical: No cervical adenopathy.  Skin:    General: Skin is warm and dry.     Capillary Refill: Capillary refill takes less than 2 seconds.     Findings: No rash.  Neurological:     General: No focal deficit present.     Mental Status: She is alert and oriented to person, place, and time.      UC Treatments /  Results  Labs (all labs ordered are listed, but only abnormal results are displayed) Labs Reviewed  RESP PANEL BY RT-PCR (FLU A&B, COVID) ARPGX2 - Abnormal; Notable for the following components:      Result Value   Influenza A by PCR POSITIVE (*)    All other components within normal limits    EKG   Radiology No results found.  Procedures Procedures (including critical care time)  Medications Ordered in UC Medications - No data to display  Initial Impression / Assessment and Plan / UC Course  I have reviewed the triage vital signs and the nursing notes.  Pertinent labs & imaging results that were available during my care of the patient were reviewed by me and considered in my medical decision making (see chart for details).   Patient is a nontoxic-appearing 67 year old female presenting for evaluation of acute onset respiratory symptoms as outlined HPI above.  Her physical exam does reveal inflamed nasal mucosa with clear rhinorrhea in both nares.  Oropharyngeal exam reveals retracted tonsillar pillars with no erythema or exudate.  Posterior pharynx does demonstrate erythema with clear postnasal drip.  No cervical lymphadenopathy present on exam.  Cardiopulmonary exam reveals clear lung sounds in all fields.  Given patient's cluster symptoms I will order a COVID and flu PCR.  Respiratory panel is positive for influenza A.  I will discharge patient on the diagnosis of influenza and start her on Tamiflu 75 mg twice daily for 5 days.  Atrovent nasal spray to help the nasal congestion, Tessalon Perles and Promethazine  DM cough syrup for cough and congestion.  Tylenol and/or ibuprofen as needed for fever or pain.   Final Clinical Impressions(s) / UC Diagnoses   Final diagnoses:  Influenza due to identified novel influenza A virus with other respiratory manifestations     Discharge Instructions      Take the Tamiflu twice daily for 5 days for treatment of influenza.  Use the  Atrovent nasal spray, 2 squirts up each nostril every 6 hours, as needed for nasal congestion and runny nose.  Use over-the-counter Tylenol and/or ibuprofen as needed for fever or pain..  Use the Tessalon Perles every 8 hours as needed for cough.  Taken with a small sip of water.  You may experience some numbness to your tongue or metallic taste in her mouth, this is normal.  Use the Promethazine DM cough syrup at bedtime as will make you drowsy but it should help dry up your postnasal drip and aid you in sleep and cough relief.  Return for reevaluation, or see your primary care provider, for new or worsening symptoms.      ED Prescriptions     Medication Sig Dispense Auth. Provider   benzonatate (TESSALON) 100 MG capsule Take 2 capsules (200 mg total) by mouth every 8 (eight) hours. 21 capsule Becky Augusta, NP   ipratropium (ATROVENT) 0.06 % nasal spray Place 2 sprays into both nostrils 4 (four) times daily. 15 mL Becky Augusta, NP   promethazine-dextromethorphan (PROMETHAZINE-DM) 6.25-15 MG/5ML syrup Take 5 mLs by mouth 4 (four) times daily as needed. 118 mL Becky Augusta, NP   oseltamivir (TAMIFLU) 75 MG capsule Take 1 capsule (75 mg total) by mouth every 12 (twelve) hours. 10 capsule Becky Augusta, NP      PDMP not reviewed this encounter.   Becky Augusta, NP 06/16/23 2029

## 2023-06-20 ENCOUNTER — Ambulatory Visit
Admission: RE | Admit: 2023-06-20 | Discharge: 2023-06-20 | Disposition: A | Discharge status: Home / Self Care | Payer: Medicare Other | Source: Ambulatory Visit | Attending: General Surgery | Admitting: General Surgery

## 2023-06-20 ENCOUNTER — Ambulatory Visit
Admission: RE | Admit: 2023-06-20 | Discharge: 2023-06-20 | Disposition: A | Discharge status: Home / Self Care | Payer: Medicare Other | Source: Ambulatory Visit | Attending: General Surgery

## 2023-06-20 DIAGNOSIS — N6313 Unspecified lump in the right breast, lower outer quadrant: Secondary | ICD-10-CM | POA: Insufficient documentation

## 2024-05-12 ENCOUNTER — Other Ambulatory Visit: Payer: Self-pay | Admitting: Internal Medicine

## 2024-05-12 DIAGNOSIS — Z1231 Encounter for screening mammogram for malignant neoplasm of breast: Secondary | ICD-10-CM

## 2024-06-22 ENCOUNTER — Ambulatory Visit
Admission: RE | Admit: 2024-06-22 | Discharge: 2024-06-22 | Disposition: A | Discharge status: Home / Self Care | Source: Ambulatory Visit | Attending: Internal Medicine | Admitting: Internal Medicine

## 2024-06-22 DIAGNOSIS — Z1231 Encounter for screening mammogram for malignant neoplasm of breast: Secondary | ICD-10-CM | POA: Diagnosis present
